# Patient Record
Sex: Male | Born: 1985 | Race: White | Hispanic: No | Marital: Single | Smoking: Former smoker
Health system: Southern US, Community
[De-identification: ages and names within clinical notes are randomized; demographics above are authoritative.]

## PROBLEM LIST (undated history)

## (undated) DIAGNOSIS — F17200 Nicotine dependence, unspecified, uncomplicated: Secondary | ICD-10-CM

## (undated) DIAGNOSIS — F988 Other specified behavioral and emotional disorders with onset usually occurring in childhood and adolescence: Secondary | ICD-10-CM

## (undated) DIAGNOSIS — L723 Sebaceous cyst: Secondary | ICD-10-CM

## (undated) HISTORY — DX: Sebaceous cyst: L72.3

## (undated) HISTORY — DX: Other specified behavioral and emotional disorders with onset usually occurring in childhood and adolescence: F98.8

## (undated) HISTORY — PX: OTHER SURGICAL HISTORY: SHX169

## (undated) HISTORY — PX: TONSILLECTOMY: SUR1361

## (undated) HISTORY — DX: Nicotine dependence, unspecified, uncomplicated: F17.200

---
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
## 2005-05-13 ENCOUNTER — Ambulatory Visit: Payer: Self-pay | Admitting: Internal Medicine

## 2005-09-29 ENCOUNTER — Ambulatory Visit: Payer: Self-pay | Admitting: Internal Medicine

## 2005-11-21 ENCOUNTER — Ambulatory Visit: Payer: Self-pay | Admitting: Internal Medicine

## 2006-02-27 ENCOUNTER — Ambulatory Visit: Payer: Self-pay | Admitting: Internal Medicine

## 2006-02-27 ENCOUNTER — Ambulatory Visit: Payer: Self-pay | Admitting: Endocrinology

## 2006-05-26 ENCOUNTER — Ambulatory Visit: Payer: Self-pay | Admitting: Internal Medicine

## 2006-08-25 ENCOUNTER — Ambulatory Visit: Payer: Self-pay | Admitting: Internal Medicine

## 2006-10-06 ENCOUNTER — Ambulatory Visit: Payer: Self-pay | Admitting: Internal Medicine

## 2007-03-23 ENCOUNTER — Ambulatory Visit: Payer: Self-pay | Admitting: Internal Medicine

## 2007-03-29 ENCOUNTER — Ambulatory Visit: Payer: Self-pay | Admitting: Internal Medicine

## 2007-08-13 ENCOUNTER — Encounter: Payer: Self-pay | Admitting: *Deleted

## 2007-08-13 DIAGNOSIS — F909 Attention-deficit hyperactivity disorder, unspecified type: Secondary | ICD-10-CM | POA: Insufficient documentation

## 2007-10-30 ENCOUNTER — Encounter: Payer: Self-pay | Admitting: Internal Medicine

## 2008-01-18 ENCOUNTER — Ambulatory Visit: Payer: Self-pay | Admitting: Internal Medicine

## 2008-01-18 DIAGNOSIS — F17200 Nicotine dependence, unspecified, uncomplicated: Secondary | ICD-10-CM | POA: Insufficient documentation

## 2008-01-18 DIAGNOSIS — L723 Sebaceous cyst: Secondary | ICD-10-CM | POA: Insufficient documentation

## 2008-04-04 ENCOUNTER — Ambulatory Visit: Payer: Self-pay | Admitting: Internal Medicine

## 2008-06-10 ENCOUNTER — Telehealth: Payer: Self-pay | Admitting: Internal Medicine

## 2008-06-17 ENCOUNTER — Telehealth: Payer: Self-pay | Admitting: Internal Medicine

## 2008-09-03 ENCOUNTER — Telehealth: Payer: Self-pay | Admitting: Internal Medicine

## 2008-12-22 ENCOUNTER — Telehealth: Payer: Self-pay | Admitting: Internal Medicine

## 2009-04-09 ENCOUNTER — Telehealth: Payer: Self-pay | Admitting: Internal Medicine

## 2009-10-01 ENCOUNTER — Telehealth: Payer: Self-pay | Admitting: Internal Medicine

## 2009-11-26 ENCOUNTER — Ambulatory Visit: Payer: Self-pay | Admitting: Internal Medicine

## 2010-04-08 ENCOUNTER — Telehealth: Payer: Self-pay | Admitting: Internal Medicine

## 2010-09-09 ENCOUNTER — Telehealth: Payer: Self-pay | Admitting: Internal Medicine

## 2010-11-27 ENCOUNTER — Emergency Department (HOSPITAL_COMMUNITY)
Admission: EM | Admit: 2010-11-27 | Discharge: 2010-11-27 | Payer: Self-pay | Source: Home / Self Care | Admitting: Emergency Medicine

## 2010-12-14 NOTE — Progress Notes (Signed)
  Phone Note Outgoing Call   Call placed by: Ami Bullins CMA,  September 10, 2010 8:47 AM Call placed to: Patient Summary of Call: called pt to inform him that prescriptions are ready. LMOVM Initial call taken by: Ami Bullins CMA,  September 10, 2010 8:48 AM  Follow-up for Phone Call        called pt and informed him that presriptions are ready for pick up, Prescriptions up front Follow-up by: Ami Bullins CMA,  September 15, 2010 2:05 PM    New/Updated Medications: ADDERALL XR 15 MG  CP24 (AMPHETAMINE-DEXTROAMPHETAMINE) 1 once daily Fill on or after 11/08/2010 ADDERALL XR 15 MG  CP24 (AMPHETAMINE-DEXTROAMPHETAMINE) 1 once daily Fill on or after 10/09/2010 ADDERALL XR 15 MG  CP24 (AMPHETAMINE-DEXTROAMPHETAMINE) 1 once daily Fill on or after 09/08/2010 ADDERALL 10 MG  TABS (AMPHETAMINE-DEXTROAMPHETAMINE) 1 by mouth PM as needed fill on or after 10/09/2010 ADDERALL 10 MG  TABS (AMPHETAMINE-DEXTROAMPHETAMINE) 1 by mouth PM as needed fill on or after 09/08/2010 ADDERALL 10 MG  TABS (AMPHETAMINE-DEXTROAMPHETAMINE) 1 by mouth PM as needed fill on or after 11/08/2010 Prescriptions: ADDERALL XR 15 MG  CP24 (AMPHETAMINE-DEXTROAMPHETAMINE) 1 once daily Fill on or after 09/08/2010  #30 x 0   Entered by:   Ami Bullins CMA   Authorized by:   Jacques Navy MD   Signed by:   Bill Salinas CMA on 09/09/2010   Method used:   Reprint   RxID:   [id number] ADDERALL XR 15 MG  CP24 (AMPHETAMINE-DEXTROAMPHETAMINE) 1 once daily Fill on or after 11/08/2010  #30 x 0   Entered by:   Ami Bullins CMA   Authorized by:   Jacques Navy MD   Signed by:   Bill Salinas CMA on 09/09/2010   Method used:   Print then Give to Patient   RxID:   [id number] ADDERALL 10 MG  TABS (AMPHETAMINE-DEXTROAMPHETAMINE) 1 by mouth PM as needed fill on or after 11/08/2010  #30 x 0   Entered by:   Ami Bullins CMA   Authorized by:   Jacques Navy MD   Signed by:   Bill Salinas CMA on 09/09/2010   Method used:   Print then  Give to Patient   RxID:   [id number] ADDERALL XR 15 MG  CP24 (AMPHETAMINE-DEXTROAMPHETAMINE) 1 once daily Fill on or after 10/09/2010  #30 x 0   Entered by:   Ami Bullins CMA   Authorized by:   Jacques Navy MD   Signed by:   Bill Salinas CMA on 09/09/2010   Method used:   Print then Give to Patient   RxID:   [id number] ADDERALL 10 MG  TABS (AMPHETAMINE-DEXTROAMPHETAMINE) 1 by mouth PM as needed fill on or after 10/09/2010  #30 x 0   Entered by:   Ami Bullins CMA   Authorized by:   Jacques Navy MD   Signed by:   Bill Salinas CMA on 09/09/2010   Method used:   Print then Give to Patient   RxID:   [id number] ADDERALL 10 MG  TABS (AMPHETAMINE-DEXTROAMPHETAMINE) 1 by mouth PM as needed fill on or after 09/08/2010  #30 x 0   Entered by:   Ami Bullins CMA   Authorized by:   Jacques Navy MD   Signed by:   Bill Salinas CMA on 09/09/2010   Method used:   Print then Give to Patient   RxID:   [id number]

## 2010-12-14 NOTE — Assessment & Plan Note (Signed)
Summary: FU ON MEDS/ NWS  #   Vital Signs:  Patient profile:   25 year old male Height:      69 inches Weight:      151 pounds BMI:     22.38 O2 Sat:      99 % on Room air Temp:     98.4 degrees F oral Pulse rate:   92 / minute BP sitting:   136 / 84  (left arm) Cuff size:   regular  Vitals Entered By: Bill Salinas CMA (November 26, 2009 3:39 PM)  O2 Flow:  Room air CC: pt here for follow up on medications. Pt needs a refill on both of his Adderall prescriptions/ab   Primary Care Provider:  Tiearra Colwell  CC:  pt here for follow up on medications. Pt needs a refill on both of his Adderall prescriptions/ab.  History of Present Illness: Has graduated from [place], [place]. 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 He is living with his dad: free rent and benefits, paid enough to make basic expenses. He has been healthy. Has resumed smoking. He still is taking adderall and prefers to resume the xr. He is also considering resuming alprazolam at bedtime to help with sleep. He would like to see a dermatologist for recurrent cysts.   Current Medications (verified): 1)  Adderall Xr 15 Mg  Cp24 (Amphetamine-Dextroamphetamine) .Marland Kitchen.. 1 Once Daily Fill On or After 05/14/2009 2)  Alprazolam 0.25 Mg  Tabs (Alprazolam) .Marland Kitchen.. 1 or 2 Prn 3)  Adderall 10 Mg  Tabs (Amphetamine-Dextroamphetamine) .Marland Kitchen.. 1 By Mouth Pm As Needed Fill On or After 10/31/2009  Allergies (verified): No Known Drug Allergies  Past History:  Past Medical History: Last updated: 01/18/2008 ADHD (ICD-314.01)  Past Surgical History: Last updated: 08/13/2007 Tonsillectomy  Family History: Last updated: 01/18/2008 father - healthy mother-healthy, mild anxiety maternal grandfather - CAD 2nd kinship w/  peptic ulcer disease, colon cancer  Social History: Last updated: 01/18/2008 Attending [employer] ('09) single  Review of Systems  The patient denies anorexia, fever, weight loss, weight gain, vision  loss, hoarseness, chest pain, dyspnea on exertion, peripheral edema, headaches, hemoptysis, abdominal pain, hematochezia, transient blindness, difficulty walking, depression, and enlarged lymph nodes.         pianful PIP joint 3rd right   Physical Exam  General:  Well-developed,well-nourished,in no acute distress; alert,appropriate and cooperative throughout examination Head:  Normocephalic and atraumatic without obvious abnormalities. No apparent alopecia or balding. Eyes:  No corneal or conjunctival inflammation noted. EOMI. Perrla. Funduscopic exam benign, without hemorrhages, exudates or papilledema. Vision grossly normal. Ears:  External ear exam shows no significant lesions or deformities.  Otoscopic examination reveals clear canals, tympanic membranes are intact bilaterally without bulging, retraction, inflammation or discharge. Hearing is grossly normal bilaterally. Neck:  full ROM, no thyromegaly, and no carotid bruits.   Lungs:  normal respiratory effort, normal breath sounds, no crackles, and no wheezes.   Heart:  normal rate, regular rhythm, and no JVD.   Abdomen:  soft, non-tender, no masses, no guarding, and no rigidity.   Msk:  normal ROM, no joint warmth, no joint instability, and no crepitation.   Pulses:  2+ radial pulse Neurologic:  alert & oriented X3, cranial nerves II-XII intact, strength normal in all extremities, gait normal, and DTRs symmetrical and normal.   Skin:  turgor normal and color normal.   Cervical Nodes:  no anterior cervical adenopathy and no posterior cervical adenopathy.  Psych:  Oriented X3, memory intact for recent and remote, and normally interactive.     Impression & Recommendations:  Problem # 1:  TOBACCO ABUSE (ICD-305.1)  Counselled on smoking cessation.  The following medications were removed from the medication list:    Chantix 1 Mg Tabs (Varenicline tartrate) .Marland Kitchen... 1 by mouth two times a day  Orders: Tobacco use cessation intensive >10  minutes (91478)  Problem # 2:  ADHD (ICD-314.01) Renewed Rx for adderall xr and adderall  Problem # 3:  Preventive Health Care (ICD-V70.0) To return for fasting lipid profile and metabolic panel  Complete Medication List: 1)  Adderall Xr 15 Mg Cp24 (Amphetamine-dextroamphetamine) .Marland Kitchen.. 1 once daily fill on or after 01/24/2010 2)  Alprazolam 0.25 Mg Tabs (Alprazolam) .Marland Kitchen.. 1 or 2 prn 3)  Adderall 10 Mg Tabs (Amphetamine-dextroamphetamine) .Marland Kitchen.. 1 by mouth pm as needed fill on or after 01/29/2010 Prescriptions: ADDERALL 10 MG  TABS (AMPHETAMINE-DEXTROAMPHETAMINE) 1 by mouth PM as needed fill on or after 01/29/2010  #30 x 0   Entered by:   Lamar Sprinkles, CMA   Authorized by:   Jacques Navy MD   Signed by:   Lamar Sprinkles, CMA on 11/26/2009   Method used:   Print then Give to Patient   RxID:   [id number] ADDERALL XR 15 MG  CP24 (AMPHETAMINE-DEXTROAMPHETAMINE) 1 once daily Fill on or after 01/24/2010  #30 x 0   Entered by:   Lamar Sprinkles, CMA   Authorized by:   Jacques Navy MD   Signed by:   Lamar Sprinkles, CMA on 11/26/2009   Method used:   Print then Give to Patient   RxID:   [id number] ADDERALL 10 MG  TABS (AMPHETAMINE-DEXTROAMPHETAMINE) 1 by mouth PM as needed fill on or after 01/01/2010  #30 x 0   Entered by:   Lamar Sprinkles, CMA   Authorized by:   Jacques Navy MD   Signed by:   Lamar Sprinkles, CMA on 11/26/2009   Method used:   Print then Give to Patient   RxID:   [id number] ADDERALL XR 15 MG  CP24 (AMPHETAMINE-DEXTROAMPHETAMINE) 1 once daily Fill on or after 12/27/2009  #30 x 0   Entered by:   Lamar Sprinkles, CMA   Authorized by:   Jacques Navy MD   Signed by:   Lamar Sprinkles, CMA on 11/26/2009   Method used:   Print then Give to Patient   RxID:   [id number] ADDERALL 10 MG  TABS (AMPHETAMINE-DEXTROAMPHETAMINE) 1 by mouth PM as needed fill on or after 12/01/2009  #30 x 0   Entered by:   Lamar Sprinkles, CMA   Authorized by:   Jacques Navy  MD   Signed by:   Lamar Sprinkles, CMA on 11/26/2009   Method used:   Print then Give to Patient   RxID:   [id number] ADDERALL XR 15 MG  CP24 (AMPHETAMINE-DEXTROAMPHETAMINE) 1 once daily Fill on or after 11/26/2009  #30 x 0   Entered by:   Lamar Sprinkles, CMA   Authorized by:   Jacques Navy MD   Signed by:   Lamar Sprinkles, CMA on 11/26/2009   Method used:   Print then Give to Patient   RxID:   [id number] ALPRAZOLAM 0.25 MG  TABS (ALPRAZOLAM) 1 or 2 prn  #60 x 5   Entered and Authorized by:   Jacques Navy MD   Signed by:   Jacques Navy MD on 11/26/2009   Method used:  Print then Give to Patient   RxID:   [id number]    Preventive Care Screening  Last Tetanus Booster:    Date:  01/14/2003    Results:  Historical

## 2010-12-14 NOTE — Progress Notes (Signed)
  Phone Note Call from Patient   Summary of Call: waiting on MD to sign prescriptions Initial call taken by: Ami Bullins CMA,  Apr 08, 2010 4:35 PM  Follow-up for Phone Call        rx sign and placed up front/ lmovm for pt to pick up.Marland KitchenMarland KitchenAlvy Beal Archie CMA  Apr 09, 2010 9:43 AM     New/Updated Medications: ADDERALL XR 15 MG  CP24 (AMPHETAMINE-DEXTROAMPHETAMINE) 1 once daily Fill on or after 03/26/2010 ADDERALL XR 15 MG  CP24 (AMPHETAMINE-DEXTROAMPHETAMINE) 1 once daily Fill on or after 04/26/2010 ADDERALL XR 15 MG  CP24 (AMPHETAMINE-DEXTROAMPHETAMINE) 1 once daily Fill on or after 05/26/2010 ADDERALL 10 MG  TABS (AMPHETAMINE-DEXTROAMPHETAMINE) 1 by mouth PM as needed fill on or after 03/31/2010 ADDERALL 10 MG  TABS (AMPHETAMINE-DEXTROAMPHETAMINE) 1 by mouth PM as needed fill on or after 05/01/2010 ADDERALL 10 MG  TABS (AMPHETAMINE-DEXTROAMPHETAMINE) 1 by mouth PM as needed fill on or after 05/31/2010 Prescriptions: ADDERALL 10 MG  TABS (AMPHETAMINE-DEXTROAMPHETAMINE) 1 by mouth PM as needed fill on or after 05/31/2010  #30 x 0   Entered by:   Ami Bullins CMA   Authorized by:   Jacques Navy MD   Signed by:   Bill Salinas CMA on 04/08/2010   Method used:   Print then Give to Patient   RxID:   [id number] ADDERALL 10 MG  TABS (AMPHETAMINE-DEXTROAMPHETAMINE) 1 by mouth PM as needed fill on or after 05/01/2010  #30 x 0   Entered by:   Ami Bullins CMA   Authorized by:   Jacques Navy MD   Signed by:   Bill Salinas CMA on 04/08/2010   Method used:   Print then Give to Patient   RxID:   [id number] ADDERALL 10 MG  TABS (AMPHETAMINE-DEXTROAMPHETAMINE) 1 by mouth PM as needed fill on or after 03/31/2010  #30 x 0   Entered by:   Ami Bullins CMA   Authorized by:   Jacques Navy MD   Signed by:   Bill Salinas CMA on 04/08/2010   Method used:   Print then Give to Patient   RxID:   [id number] ADDERALL XR 15 MG  CP24 (AMPHETAMINE-DEXTROAMPHETAMINE) 1 once daily Fill on or  after 05/26/2010  #30 x 0   Entered by:   Ami Bullins CMA   Authorized by:   Jacques Navy MD   Signed by:   Bill Salinas CMA on 04/08/2010   Method used:   Print then Give to Patient   RxID:   [id number] ADDERALL XR 15 MG  CP24 (AMPHETAMINE-DEXTROAMPHETAMINE) 1 once daily Fill on or after 04/26/2010  #30 x 0   Entered by:   Ami Bullins CMA   Authorized by:   Jacques Navy MD   Signed by:   Bill Salinas CMA on 04/08/2010   Method used:   Print then Give to Patient   RxID:   [id number] ADDERALL XR 15 MG  CP24 (AMPHETAMINE-DEXTROAMPHETAMINE) 1 once daily Fill on or after 03/26/2010  #30 x 0   Entered by:   Ami Bullins CMA   Authorized by:   Jacques Navy MD   Signed by:   Bill Salinas CMA on 04/08/2010   Method used:   Print then Give to Patient   RxID:   [id number]

## 2012-02-29 ENCOUNTER — Encounter (HOSPITAL_COMMUNITY): Payer: Self-pay | Admitting: Emergency Medicine

## 2012-02-29 ENCOUNTER — Emergency Department (HOSPITAL_COMMUNITY): Payer: BC Managed Care – PPO

## 2012-02-29 ENCOUNTER — Emergency Department (HOSPITAL_COMMUNITY)
Admission: EM | Admit: 2012-02-29 | Discharge: 2012-03-01 | Disposition: A | Discharge status: Home / Self Care | Payer: BC Managed Care – PPO | Attending: Emergency Medicine | Admitting: Emergency Medicine

## 2012-02-29 DIAGNOSIS — J40 Bronchitis, not specified as acute or chronic: Secondary | ICD-10-CM

## 2012-02-29 DIAGNOSIS — J3489 Other specified disorders of nose and nasal sinuses: Secondary | ICD-10-CM | POA: Insufficient documentation

## 2012-02-29 DIAGNOSIS — R059 Cough, unspecified: Secondary | ICD-10-CM | POA: Insufficient documentation

## 2012-02-29 DIAGNOSIS — R509 Fever, unspecified: Secondary | ICD-10-CM

## 2012-02-29 DIAGNOSIS — F17200 Nicotine dependence, unspecified, uncomplicated: Secondary | ICD-10-CM | POA: Insufficient documentation

## 2012-02-29 DIAGNOSIS — R05 Cough: Secondary | ICD-10-CM | POA: Insufficient documentation

## 2012-02-29 MED ORDER — IPRATROPIUM BROMIDE 0.02 % IN SOLN
0.5000 mg | Freq: Once | RESPIRATORY_TRACT | Status: AC
  Administered 2012-02-29: 0.5 mg via RESPIRATORY_TRACT
  Filled 2012-02-29: qty 2.5

## 2012-02-29 MED ORDER — IBUPROFEN 200 MG PO TABS
600.0000 mg | ORAL_TABLET | Freq: Once | ORAL | Status: AC
  Administered 2012-02-29: 600 mg via ORAL
  Filled 2012-02-29: qty 3

## 2012-02-29 MED ORDER — ALBUTEROL SULFATE (5 MG/ML) 0.5% IN NEBU
5.0000 mg | INHALATION_SOLUTION | Freq: Once | RESPIRATORY_TRACT | Status: AC
  Administered 2012-02-29: 5 mg via RESPIRATORY_TRACT

## 2012-02-29 NOTE — ED Notes (Signed)
Pt states he has been running a fever off and on since Monday  Pt states he has been taking tylenol and ibuprofen for the fever  Pt states he developed a cough Friday  Pt states cough is productive yellow/brown sputum  Pt states has been using Mucinex  Pt states if he takes a deep breath or laughs it causes him to cough  Pt states about an hour prior to coming in his temp 102.8  Last dose of ibuprofen was about 12 noon today  Pt states since noon he has taken chest congestion mucus relief and benzonatate 200mg 

## 2012-03-01 MED ORDER — ALBUTEROL SULFATE HFA 108 (90 BASE) MCG/ACT IN AERS
2.0000 | INHALATION_SPRAY | RESPIRATORY_TRACT | Status: DC
  Filled 2012-03-01: qty 6.7

## 2012-03-01 MED ORDER — AZITHROMYCIN 250 MG PO TABS: 250.0000 mg | ORAL_TABLET | Freq: Every day | ORAL | Status: AC

## 2012-03-01 NOTE — Discharge Instructions (Signed)

## 2012-03-01 NOTE — ED Provider Notes (Signed)
History     CSN: [id number]  Arrival date & time 02/29/12  1941   First MD Initiated Contact with Patient 02/29/12 2301      Chief Complaint  Patient presents with  . Fever     The history is provided by the patient.   the patient reports coughing congestion for approximately 5 days and now has developed fever over the last 3 days.  He denies shortness of breath but does report a tightness in his chest.  He reports when he coughs and makes his chest hurt.  He's had diarrhea he denies nausea and vomiting.  His symptoms are mild to moderate in severity.  Nothing worsens the symptoms.  Nothing improves his ..symptoms.  The patient continues to smoke cigarettes.  He denies shortness of breath.  He denies unilateral leg swelling   History reviewed. No pertinent past medical history.  Past Surgical History  Procedure Date  . Tonsillectomy   . Oral surgeries     Family History  Problem Relation Age of Onset  . Hypotension Father   . Diabetes Other     History  Substance Use Topics  . Smoking status: Current Everyday Smoker    Types: Cigarettes  . Smokeless tobacco: Not on file  . Alcohol Use: Yes     occassional      Review of Systems  Constitutional: Positive for fever.  All other systems reviewed and are negative.    Allergies  Review of patient's allergies indicates no known allergies.  Home Medications   Current Outpatient Rx  Name Route Sig Dispense Refill  . IBUPROFEN 200 MG PO TABS Oral Take 600 mg by mouth every 6 (six) hours as needed.    . AZITHROMYCIN 250 MG PO TABS Oral Take 1 tablet (250 mg total) by mouth daily. Take 2 tabs for first dose, then 1 tab for each additional dose 6 tablet 0    BP 120/61  Pulse 98  Temp(Src) 100.2 F (37.9 C) (Oral)  Resp 20  Wt 168 lb (76.204 kg)  SpO2 100%  Physical Exam  Nursing note and vitals reviewed. Constitutional: He is oriented to person, place, and time. He appears well-developed and well-nourished.    HENT:  Head: Normocephalic and atraumatic.  Eyes: EOM are normal.  Neck: Normal range of motion.  Cardiovascular: Normal rate, regular rhythm, normal heart sounds and intact distal pulses.   Pulmonary/Chest: Effort normal and breath sounds normal. No respiratory distress.  Abdominal: Soft. He exhibits no distension. There is no tenderness.  Musculoskeletal: Normal range of motion.  Neurological: He is alert and oriented to person, place, and time.  Skin: Skin is warm and dry.  Psychiatric: He has a normal mood and affect. Judgment normal.    ED Course  Procedures (including critical care time)  Labs Reviewed - No data to display Dg Chest 2 View  02/29/2012  *RADIOLOGY REPORT*  Clinical Data: Cough and fever.  Tightness in chest.  CHEST - 2 VIEW  Comparison: None.  Findings: Lungs are clear.  Heart size is normal.  No pneumothorax or pleural effusion.  IMPRESSION: Negative chest.  Original Report Authenticated By: Bernadene Bell. D'ALESSIO, M.D.     1. Bronchitis   2. Fever       MDM  This appears to have a bronchitis.  He feels much better after the breathing treatment.  Home with albuterol and a Z-Pak.  His chest x-ray has no acute pneumonia, however will treat for possible atypical pneumonia.  Patient understands to return to the ER for new or worsening symptoms        Lyanne Co, MD 03/01/12 [phone]

## 2012-06-26 ENCOUNTER — Encounter: Payer: Self-pay | Admitting: Internal Medicine

## 2012-06-26 ENCOUNTER — Ambulatory Visit (INDEPENDENT_AMBULATORY_CARE_PROVIDER_SITE_OTHER): Payer: BC Managed Care – PPO | Admitting: Internal Medicine

## 2012-06-26 VITALS — BP 114/80 | HR 68 | Temp 97.7°F | Resp 16 | Wt 165.0 lb

## 2012-06-26 DIAGNOSIS — F17200 Nicotine dependence, unspecified, uncomplicated: Secondary | ICD-10-CM

## 2012-06-26 DIAGNOSIS — M25519 Pain in unspecified shoulder: Secondary | ICD-10-CM

## 2012-06-26 DIAGNOSIS — M25511 Pain in right shoulder: Secondary | ICD-10-CM

## 2012-06-26 DIAGNOSIS — F909 Attention-deficit hyperactivity disorder, unspecified type: Secondary | ICD-10-CM

## 2012-06-26 MED ORDER — AMPHETAMINE-DEXTROAMPHET ER 10 MG PO CP24: 10.0000 mg | ORAL_CAPSULE | ORAL | Status: DC

## 2012-06-26 NOTE — Assessment & Plan Note (Signed)
Is working hard on cessation - carries pictures of diseased lungs as screen saver on PDA/smart phone.  Plan Encouraged his efforts  Resume medical treatment ADHD since he is not medicating with nicotine.

## 2012-06-26 NOTE — Progress Notes (Signed)
  Subjective:    Patient ID: Matthew Morris, male    DOB: 09/21/1986, 26 y.o.   MRN: [id number]  HPI Matthew Morris presents to discuss smoking cessation. He is also having trouble with focus and concentration, difficulty completing tasks. He feels he is very distractable.  He is having right shoulder pain: strained the shoulder working out. He has been doing some home treatment which helped. But, he notices there is a "pop" with use particularly with  Rotational movement. He does not have pain. He wants further evaluation as to the nature of the injury and what he can or cannot do to avoid further injury.  Past Medical History  Diagnosis Date  . ADD (attention deficit disorder)    Past Surgical History  Procedure Date  . Tonsillectomy   . Oral surgeries    Family History  Problem Relation Age of Onset  . Hypotension Father   . Diabetes Other   . Depression Mother    History   Social History  . Marital Status: Single    Spouse Name: N/A    Number of Children: 0  . Years of Education: 16   Occupational History  . Manufacturing engineer    Social History Main Topics  . Smoking status: Former Smoker    Types: Cigarettes    Quit date: 04/26/2012  . Smokeless tobacco: Never Used  . Alcohol Use: Yes     occassional  . Drug Use: Yes    Special: Marijuana  . Sexually Active: Yes -- Male partner(s)   Other Topics Concern  . Not on file   Social History Narrative   HSG, [employer] BS, single. 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    Current Outpatient Prescriptions on File Prior to Visit  Medication Sig Dispense Refill  . ibuprofen (ADVIL,MOTRIN) 200 MG tablet Take 600 mg by mouth every 6 (six) hours as needed.      Marland Kitchen amphetamine-dextroamphetamine (ADDERALL XR) 10 MG 24 hr capsule Take 1 capsule (10 mg total) by mouth every morning.  30 capsule  0      Review of Systems System review is negative for any constitutional, cardiac,  pulmonary, GI or neuro symptoms or complaints other than as described in the HPI.     Objective:   Physical Exam Filed Vitals:   06/26/12 1314  BP: 114/80  Pulse: 68  Temp: 97.7 F (36.5 C)  Resp: 16   Gen'l- WNWD white man in no distress. HEENT- C&S clear Cor- RRR Pulm - normal respirations MSK - right shoulder with passive full ROM, no crepitus, no click.        Assessment & Plan:  Shoulder pain, right - suspect partial rotator cuff tear. Plan  Refer to Dr. Farris Has @ [facility] for sports medicine evaluation.

## 2012-06-26 NOTE — Assessment & Plan Note (Signed)
H/o ADHD and on adderall during college. He now reports increased distractability and focus, difficulty with task completion, exacerbated with absence of nicotine.  Plan Resume Adderall XR 10 mg qAM -   Completed Controlled Substance Contract  Rx for 3 months.

## 2012-10-03 ENCOUNTER — Other Ambulatory Visit: Payer: Self-pay | Admitting: *Deleted

## 2012-10-03 MED ORDER — AMPHETAMINE-DEXTROAMPHET ER 10 MG PO CP24: 10.0000 mg | ORAL_CAPSULE | ORAL | Status: DC

## 2012-10-09 ENCOUNTER — Other Ambulatory Visit (INDEPENDENT_AMBULATORY_CARE_PROVIDER_SITE_OTHER): Payer: BC Managed Care – PPO

## 2012-10-09 ENCOUNTER — Ambulatory Visit (INDEPENDENT_AMBULATORY_CARE_PROVIDER_SITE_OTHER): Payer: BC Managed Care – PPO | Admitting: Internal Medicine

## 2012-10-09 ENCOUNTER — Encounter: Payer: Self-pay | Admitting: Internal Medicine

## 2012-10-09 ENCOUNTER — Other Ambulatory Visit: Payer: Self-pay | Admitting: *Deleted

## 2012-10-09 VITALS — BP 116/78 | HR 83 | Temp 97.6°F | Resp 10 | Wt 167.0 lb

## 2012-10-09 DIAGNOSIS — Z0000 Encounter for general adult medical examination without abnormal findings: Secondary | ICD-10-CM

## 2012-10-09 DIAGNOSIS — F17200 Nicotine dependence, unspecified, uncomplicated: Secondary | ICD-10-CM

## 2012-10-09 DIAGNOSIS — F909 Attention-deficit hyperactivity disorder, unspecified type: Secondary | ICD-10-CM

## 2012-10-09 LAB — LIPID PANEL
Total CHOL/HDL Ratio: 5
Triglycerides: 285 mg/dL — ABNORMAL HIGH (ref 0.0–149.0)

## 2012-10-09 LAB — COMPREHENSIVE METABOLIC PANEL
AST: 25 U/L (ref 0–37)
Albumin: 4.3 g/dL (ref 3.5–5.2)
Alkaline Phosphatase: 69 U/L (ref 39–117)
BUN: 13 mg/dL (ref 6–23)
Potassium: 4.1 mEq/L (ref 3.5–5.1)
Sodium: 138 mEq/L (ref 135–145)

## 2012-10-09 MED ORDER — AMPHETAMINE-DEXTROAMPHET ER 10 MG PO CP24: 10.0000 mg | ORAL_CAPSULE | ORAL | Status: DC

## 2012-10-09 NOTE — Patient Instructions (Addendum)
Mr. Tuite is a healthy appearing 26 year old male who presents for medication follow-up.   1. ADHD -Continue Adderall 10 mg daily  2. Smoking Cessation -Last cigarette was Jun 21 2012 -Congratulations! Keep up the good work!! -Let us know if we can help with medications or nicotine replacement therapy should you need it in the future

## 2012-10-09 NOTE — Progress Notes (Signed)
Patient ID: VICKY MCCANLESS, male   DOB: Mar 28, 1986, 26 y.o.   MRN: [id number]  *needs prescription refill  HPI Mr. Lomax is a very pleasant 26 yo male who presents for medication follow-up. He has ADHD and recently starting retaking Adderall 10 mg daily. He recently quit smoking and noted increased attention problems with smoking cessation. He was restarted on Adderall at that time.  He feels that the Adderall is currently at an effective dose. Smoking cessation has continued and is going well; last cigarette was August 8. He has not supplemented with nicotine gum or patches. He uses regular gum, toothpicks and jogging to maintain his success. He feels confident that he has quit permanently.  He takes no other medications and feels well overall.   ROS -Denies fevers, chills, cough, muscle aches +Improvement in breathing with smoking cessation +Improvement in exercise tolerance with smoking cessation   Past Medical History  Diagnosis Date  . ADD (attention deficit disorder)    Past Surgical History  Procedure Date  . Tonsillectomy   . Oral surgeries    Family History  Problem Relation Age of Onset  . Hypotension Father   . Diabetes Other   . Depression Mother    History   Social History  . Marital Status: Single    Spouse Name: N/A    Number of Children: 0  . Years of Education: 16   Occupational History  . Manufacturing engineer    Social History Main Topics  . Smoking status: Former Smoker    Types: Cigarettes    Quit date: 04/26/2012  . Smokeless tobacco: Never Used  . Alcohol Use: Yes     Comment: occassional  . Drug Use: Yes    Special: Marijuana  . Sexually Active: Yes -- Male partner(s)   Other Topics Concern  . Not on file   Social History Narrative   HSG, [employer] BS, single. 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   Current Outpatient Prescriptions on File Prior to Visit  Medication Sig Dispense Refill   . amphetamine-dextroamphetamine (ADDERALL XR) 10 MG 24 hr capsule Take 1 capsule (10 mg total) by mouth every morning. To be filled on or after 12/03/12  30 capsule  0  . ibuprofen (ADVIL,MOTRIN) 200 MG tablet Take 600 mg by mouth every 6 (six) hours as needed.       PE General: Healthy, fit appearing young male in NAD CV: Regular rate and rhythm, S1 and S2 present. Pulse regular. Pulm: Lungs clear to auscultation bilaterally Neuro: Alert and appropriate. No focal deficits.  A/P Mr. Campoy is a 26 yo male who presents for medication follow-up.   # ADHD -Continue Adderall 10 mg daily  # Smoking Cessation -Last cigarette was Jun 21 2012 -Congratulations and encouragement given  Patient interviewed and examined. I agree with the assessment and plan per Ms. Landa Mullinax, MS III  M.Norins, MD

## 2012-10-10 LAB — LDL CHOLESTEROL, DIRECT: Direct LDL: 136.3 mg/dL

## 2012-10-11 NOTE — Assessment & Plan Note (Signed)
Doing well. Adderall XR 10 mg is helpful. He reports normal sleep pattern. He denies any untoward symptoms.  Plan - renewal of adderall Rx.

## 2012-10-11 NOTE — Assessment & Plan Note (Signed)
Smoke free since August '13 . [facility]!!!!!

## 2012-12-19 ENCOUNTER — Ambulatory Visit (INDEPENDENT_AMBULATORY_CARE_PROVIDER_SITE_OTHER): Payer: BC Managed Care – PPO | Admitting: Internal Medicine

## 2012-12-19 ENCOUNTER — Encounter: Payer: Self-pay | Admitting: Internal Medicine

## 2012-12-19 ENCOUNTER — Other Ambulatory Visit: Payer: Self-pay | Admitting: *Deleted

## 2012-12-19 VITALS — BP 120/80 | HR 92 | Temp 97.8°F | Resp 10 | Wt 159.0 lb

## 2012-12-19 DIAGNOSIS — N489 Disorder of penis, unspecified: Secondary | ICD-10-CM

## 2012-12-19 DIAGNOSIS — F909 Attention-deficit hyperactivity disorder, unspecified type: Secondary | ICD-10-CM

## 2012-12-19 DIAGNOSIS — R21 Rash and other nonspecific skin eruption: Secondary | ICD-10-CM

## 2012-12-19 DIAGNOSIS — J069 Acute upper respiratory infection, unspecified: Secondary | ICD-10-CM

## 2012-12-19 MED ORDER — AMPHETAMINE-DEXTROAMPHET ER 10 MG PO CP24: 10.0000 mg | ORAL_CAPSULE | ORAL | Status: DC

## 2012-12-19 NOTE — Patient Instructions (Addendum)
Rash - sounds like and looks like either a mild yeast infection vs abrasion with soreness Plan - it would be ok to use topical antifungal for 3-4 days  URI - most likely a viral infection. Lungs are very clear, no enlarged lymph nodes. No indication for antibiotics Plan - cough in your sleeve  Cough syrup of choice - doctor preference is Robitussin DM or generic.  Don't pick up those smokes again!!

## 2012-12-19 NOTE — Progress Notes (Signed)
  Subjective:    Patient ID: Matthew Morris, male    DOB: 09/07/1986, 27 y.o.   MRN: [id number]  HPI Had URI symptoms that started Friday and continued through the weekend. Temperature to 100.4. Did a lot of self care and feels a lot better. He still feels he has some chest congestion: coughs with laughing.   Needs adderall Rx's   Had unprotected intercourse with girlfriend of several months. Developed an erythematous pruritic rash at the glans penis and shaft. He only used a topical antifungal for one day. Still having some sensitivity.  Past Medical History  Diagnosis Date  . ADD (attention deficit disorder)    Past Surgical History  Procedure Date  . Tonsillectomy   . Oral surgeries    Family History  Problem Relation Age of Onset  . Hypotension Father   . Diabetes Other   . Depression Mother    History   Social History  . Marital Status: Single    Spouse Name: N/A    Number of Children: 0  . Years of Education: 16   Occupational History  . Manufacturing engineer    Social History Main Topics  . Smoking status: Former Smoker    Types: Cigarettes    Quit date: 04/26/2012  . Smokeless tobacco: Never Used  . Alcohol Use: Yes     Comment: occassional  . Drug Use: Yes    Special: Marijuana  . Sexually Active: Yes -- Male partner(s)   Other Topics Concern  . Not on file   Social History Narrative   HSG, [place] BS, single. 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   Current Outpatient Prescriptions on File Prior to Visit  Medication Sig Dispense Refill  . amphetamine-dextroamphetamine (ADDERALL XR) 10 MG 24 hr capsule Take 1 capsule (10 mg total) by mouth every morning. To be filled on or after 12/09/12.  30 capsule  0  . ibuprofen (ADVIL,MOTRIN) 200 MG tablet Take 600 mg by mouth every 6 (six) hours as needed.           Review of Systems System review is negative for any constitutional, cardiac, pulmonary, GI or  neuro symptoms or complaints other than as described in the HPI.     Objective:   Physical Exam Filed Vitals:   12/19/12 0928  BP: 120/80  Pulse: 92  Temp: 97.8 F (36.6 C)  Resp: 10   Gen'l0- WNWD white man in no distress HEENT- posterior pharynx with mild cobblestone appearance. Nodes - negative submandibular, cervical or supraclavicular Cor- RRR Pulm - good breath sounds, no rales or wheezes or rhonchi. No increased work of breathing. Genitalia - normal circumcised penis. Mild erythema of the shaft at the glans. Minimal raised areas 2mm but no open lesions       Assessment & Plan:  1. URI - lungs are clear, remainder or exam negative. No indications for antibiotics Plan  Supportive care.  2. Penile rash - suspect mild abrasion vs topical yeast infection Plan Topical antifungal bid for 5 days.

## 2012-12-20 NOTE — Assessment & Plan Note (Signed)
Refill Rx for Adderall provided.

## 2012-12-29 ENCOUNTER — Other Ambulatory Visit: Payer: Self-pay

## 2013-04-03 ENCOUNTER — Ambulatory Visit (INDEPENDENT_AMBULATORY_CARE_PROVIDER_SITE_OTHER): Payer: BC Managed Care – PPO | Admitting: Internal Medicine

## 2013-04-03 ENCOUNTER — Ambulatory Visit (INDEPENDENT_AMBULATORY_CARE_PROVIDER_SITE_OTHER)
Admission: RE | Admit: 2013-04-03 | Discharge: 2013-04-03 | Disposition: A | Discharge status: Home / Self Care | Payer: BC Managed Care – PPO | Source: Ambulatory Visit | Attending: Internal Medicine | Admitting: Internal Medicine

## 2013-04-03 ENCOUNTER — Encounter: Payer: Self-pay | Admitting: Internal Medicine

## 2013-04-03 VITALS — BP 120/68 | HR 80 | Temp 98.4°F | Wt 152.4 lb

## 2013-04-03 DIAGNOSIS — M65839 Other synovitis and tenosynovitis, unspecified forearm: Secondary | ICD-10-CM

## 2013-04-03 DIAGNOSIS — M79609 Pain in unspecified limb: Secondary | ICD-10-CM

## 2013-04-03 DIAGNOSIS — M79644 Pain in right finger(s): Secondary | ICD-10-CM

## 2013-04-03 DIAGNOSIS — F909 Attention-deficit hyperactivity disorder, unspecified type: Secondary | ICD-10-CM

## 2013-04-03 DIAGNOSIS — M778 Other enthesopathies, not elsewhere classified: Secondary | ICD-10-CM

## 2013-04-03 DIAGNOSIS — F17200 Nicotine dependence, unspecified, uncomplicated: Secondary | ICD-10-CM

## 2013-04-03 MED ORDER — AMPHETAMINE-DEXTROAMPHET ER 15 MG PO CP24: 15.0000 mg | ORAL_CAPSULE | ORAL | Status: DC

## 2013-04-03 NOTE — Assessment & Plan Note (Signed)
Slight increase Adderall provided today follow up PCP in 4-6 weeks on same Advised over-the-counter therapies for associated insomnia

## 2013-04-03 NOTE — Patient Instructions (Signed)
It was good to see you today. We have reviewed your prior records including labs and tests today Test(s) ordered today - thumb x-ray. Your results will be released to MyChart (or called to you) after review, usually within 72hours after test completion. If any changes need to be made, you will be notified at that same time. No indication for chest x-ray, but you should continue to work on smoking cessation as discussed For thumb pain, take Aleve one tablet ( 220 mg)  each morning for 2 weeks Increase dose of Adderall as discussed Follow up with Dr. Debby Bud on medications and symptoms in 4-8 weeks, call sooner if problems  Tendinitis Tendinitis is swelling and inflammation of the tendons. Tendons are band-like tissues that connect muscle to bone. Tendinitis commonly occurs in the:   Shoulders (rotator cuff).  Heels (Achilles tendon).  Elbows (triceps tendon). CAUSES Tendinitis is usually caused by overusing the tendon, muscles, and joints involved. When the tissue surrounding a tendon (synovium) becomes inflamed, it is called tenosynovitis. Tendinitis commonly develops in people whose jobs require repetitive motions. SYMPTOMS  Pain.  Tenderness.  Mild swelling. DIAGNOSIS Tendinitis is usually diagnosed by physical exam. Your caregiver may also order X-rays or other imaging tests. TREATMENT Your caregiver may recommend certain medicines or exercises for your treatment. HOME CARE INSTRUCTIONS   Use a sling or splint for as long as directed by your caregiver until the pain decreases.  Put ice on the injured area.  Put ice in a plastic bag.  Place a towel between your skin and the bag.  Leave the ice on for 15-20 minutes, 3-4 times a day.  Avoid using the limb while the tendon is painful. Perform gentle range of motion exercises only as directed by your caregiver. Stop exercises if pain or discomfort increase, unless directed otherwise by your caregiver.  Only take  over-the-counter or prescription medicines for pain, discomfort, or fever as directed by your caregiver. SEEK MEDICAL CARE IF:   Your pain and swelling increase.  You develop new, unexplained symptoms, especially increased numbness in the hands. MAKE SURE YOU:   Understand these instructions.  Will watch your condition.  Will get help right away if you are not doing well or get worse. Document Released: 10/28/2000 Document Revised: 01/23/2012 Document Reviewed: 01/17/2011 [facility] Patient Information 2014 [place], [place].

## 2013-04-03 NOTE — Progress Notes (Signed)
  Subjective:    Patient ID: Matthew Morris, male    DOB: 1985-11-22, 27 y.o.   MRN: [id number]  HPI  Here for followup Also complains of thumb pain Also ?increase dose adderall to help stop smoking Also reports poor sleep Also requests CXR   Past Medical History  Diagnosis Date  . ADD (attention deficit disorder)   . TOBACCO ABUSE   . Sebaceous cyst     Review of Systems  Constitutional: Negative for fever and fatigue.  Respiratory: Negative for cough and shortness of breath.   Cardiovascular: Negative for chest pain and leg swelling.       Objective:   Physical Exam  BP 120/68  Pulse 80  Temp(Src) 98.4 F (36.9 C) (Oral)  Wt 152 lb 6.4 oz (69.128 kg)  BMI 22.5 kg/m2  SpO2 99% Wt Readings from Last 3 Encounters:  04/03/13 152 lb 6.4 oz (69.128 kg)  12/19/12 159 lb 0.6 oz (72.14 kg)  10/09/12 167 lb (75.751 kg)   Constitutional: he appears well-developed and well-nourished. No distress.  Cardiovascular: Normal rate, regular rhythm and normal heart sounds.  No murmur heard. No BLE edema. Pulmonary/Chest: Effort normal and breath sounds normal. No respiratory distress. he has no wheezes.  Musculoskeletal: R thumb - FROM, no joint swelling at [facility] and PIP. nontender to palpation, ligamentous function intact. No gross deformities Skin: No bruising or rash. Skin is warm and dry. No erythema.  Psychiatric: he has a normal mood and affect. behavior is normal. Judgment and thought content normal.  Lab Results  Component Value Date   GLUCOSE 94 10/09/2012   CHOL 206* 10/09/2012   TRIG 285.0* 10/09/2012   HDL 44.00 10/09/2012   LDLDIRECT 136.3 10/09/2012   ALT 21 10/09/2012   AST 25 10/09/2012   NA 138 10/09/2012   K 4.1 10/09/2012   CL 100 10/09/2012   CREATININE 1.0 10/09/2012   BUN 13 10/09/2012   CO2 31 10/09/2012       Assessment & Plan:   R thumb tendonitis,  - traumatic induced injury 6 weeks ago - ligamentous function intact with good strength - no  joint swelling but check xray to look for [facility] OA changes or damage at pt request. Advised Aleve qd x 2 weeks - reassurance  Smoker - asymptomatic but advised on need to quit smoking -  pt requests xray (as mom with lung ca) -I advised no indication for same given 27 and no symptoms (no cough or shortness of breath) -  5 minutes today spent counseling patient on unhealthy effects of continued tobacco abuse and encouragement of cessation including medical options available to help the patient quit smoking.

## 2013-05-06 ENCOUNTER — Telehealth: Payer: Self-pay | Admitting: Internal Medicine

## 2013-05-06 NOTE — Telephone Encounter (Signed)
Pt was wondering if Dr. Debby Bud can give some adderall to last him until he come in for an appt which is 05/27/13. Pt will be out by 05/15/13. Please advise.

## 2013-05-06 NOTE — Telephone Encounter (Signed)
Ok for 30 days supply

## 2013-05-07 MED ORDER — AMPHETAMINE-DEXTROAMPHET ER 15 MG PO CP24: 15.0000 mg | ORAL_CAPSULE | ORAL | Status: DC

## 2013-05-07 NOTE — Telephone Encounter (Signed)
Left voicemail msg script can be picked up today.

## 2013-05-07 NOTE — Telephone Encounter (Signed)
Script printed and waiting to be signed

## 2013-05-27 ENCOUNTER — Ambulatory Visit (INDEPENDENT_AMBULATORY_CARE_PROVIDER_SITE_OTHER): Payer: BC Managed Care – PPO | Admitting: Internal Medicine

## 2013-05-27 ENCOUNTER — Encounter: Payer: Self-pay | Admitting: Internal Medicine

## 2013-05-27 VITALS — BP 120/80 | HR 66 | Temp 97.3°F | Resp 8 | Wt 157.8 lb

## 2013-05-27 DIAGNOSIS — F909 Attention-deficit hyperactivity disorder, unspecified type: Secondary | ICD-10-CM

## 2013-05-27 DIAGNOSIS — F17200 Nicotine dependence, unspecified, uncomplicated: Secondary | ICD-10-CM

## 2013-05-27 MED ORDER — AMPHETAMINE-DEXTROAMPHET ER 15 MG PO CP24: 15.0000 mg | ORAL_CAPSULE | ORAL | Status: DC

## 2013-05-27 NOTE — Assessment & Plan Note (Signed)
Has been smoking but is working hard on cessation again. Given support

## 2013-05-27 NOTE — Patient Instructions (Addendum)
Work on the smoking

## 2013-05-27 NOTE — Assessment & Plan Note (Signed)
Stable and doing well. No diet issues, no hyperactivity issues. Holding work.  Plan Refill Rx x 3 months.

## 2013-05-27 NOTE — Progress Notes (Signed)
  Subjective:    Patient ID: Matthew Morris, male    DOB: 1986/10/10, 27 y.o.   MRN: [id number]  HPI Presents for med refill on adderall. He has been doing well.  He had a large bruise over the greater trochanter on the right hip.  He had a small insect bite at the edge of the bruise.  Past Medical History  Diagnosis Date  . ADD (attention deficit disorder)   . TOBACCO ABUSE   . Sebaceous cyst    Past Surgical History  Procedure Laterality Date  . Tonsillectomy    . Oral surgeries     Family History  Problem Relation Age of Onset  . Hypotension Father   . Diabetes Other   . Depression Mother    History   Social History  . Marital Status: Single    Spouse Name: N/A    Number of Children: 0  . Years of Education: 16   Occupational History  . Manufacturing engineer    Social History Main Topics  . Smoking status: Current Every Day Smoker    Types: Cigarettes    Last Attempt to Quit: 04/26/2012  . Smokeless tobacco: Never Used  . Alcohol Use: Yes     Comment: occassional  . Drug Use: Yes    Special: Marijuana  . Sexually Active: Yes -- Male partner(s)   Other Topics Concern  . Not on file   Social History Narrative   HSG, [place] BS, single. 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 Just built his own house, 1900 sq ft. Mother Barbie Haggis) recovering from cancer. Brother - family trying to help him overcome EtOH issues.    No current outpatient prescriptions on file prior to visit.   No current facility-administered medications on file prior to visit.      Review of Systems System review is negative for any constitutional, cardiac, pulmonary, GI or neuro symptoms or complaints other than as described in the HPI.      Objective:   Physical Exam Filed Vitals:   05/27/13 0945  BP: 120/80  Pulse: 66  Temp: 97.3 F (36.3 C)  Resp: 8   Gen'l- WNWD white man in no distress COr- RRR Pulm - normal  respirations Derm - resolving bruise over greater trochanter right hip Neuro - stable      Assessment & Plan:

## 2013-07-02 ENCOUNTER — Ambulatory Visit (INDEPENDENT_AMBULATORY_CARE_PROVIDER_SITE_OTHER): Payer: BC Managed Care – PPO | Admitting: Internal Medicine

## 2013-07-02 ENCOUNTER — Encounter: Payer: Self-pay | Admitting: Internal Medicine

## 2013-07-02 VITALS — BP 110/72 | HR 68 | Temp 97.9°F | Wt 159.0 lb

## 2013-07-02 DIAGNOSIS — R109 Unspecified abdominal pain: Secondary | ICD-10-CM

## 2013-07-02 NOTE — Patient Instructions (Addendum)
Thanks for working with me Romeo Apple) today.  Side pain: this does not seem to be anything dangerous.  If this continues to be a problem, please take note of the circumstances surrounding the episodes (what you were doing, whether you ate anything recently, etc.) Plan: If the pain gets worse or lasts longer, please return to the clinic. Otherwise, bring a log of events to your next visit.

## 2013-07-02 NOTE — Progress Notes (Signed)
Subjective:     Patient ID: Matthew Morris, male   DOB: Oct 15, 1986, 27 y.o.   MRN: [id number]  HPI  Mr. Depass presents to clinic due to a shooting pain on left side which has bothered him sporadically for the past few weeks.  He rates it as 6/10 and distracts him from whatever he was doing.  The pain has not changed in character over its course.  This pain will last for 5-15 seconds and resolves on its own.  He has not been able to detect any pattern to the onset of this pain, either temporarily (it occurs both in the morning and at night) or with regards to any particular activity.  He states that it has occurred twice after eating fatty meals, but has also bothered him several mornings after eating a light breakfast.  He has not identified any provoking or alleviating factors.  He notes that he has been able to apply pressure and that seems to improve the pain, but since the pain persists for such a short time, he has not been able to try anything to improve the pain.  Past Medical History  Diagnosis Date   ADD (attention deficit disorder)    TOBACCO ABUSE    Sebaceous cyst    Past Surgical History  Procedure Laterality Date   Tonsillectomy     Oral surgeries     Family History  Problem Relation Age of Onset   Hypotension Father    Diabetes Other    Depression Mother    History   Social History   Marital Status: Single    Spouse Name: N/A    Number of Children: 0   Years of Education: 16   Occupational History   Manufacturing engineer    Social History Main Topics   Smoking status: Current Every Day Smoker    Types: Cigarettes    Last Attempt to Quit: 04/26/2012   Smokeless tobacco: Never Used   Alcohol Use: Yes     Comment: occassional   Drug Use: Yes    Special: Marijuana   Sexual Activity: Yes    Partners: Female   Other Topics Concern   Not on file   Social History Narrative   HSG, [place] BS, single. 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 Just built his own house, 1900 sq ft. Mother Barbie Haggis) recovering from cancer. Brother - family trying to help him overcome EtOH issues.   Review of Systems Constitutional: Negative for change in activity, no chills, no diaphoresis HEENT: Negative for acute changes in vision/hearing, ear pain, or trouble swallowing  Pulm: Negative for coughing, wheezing or SOB  CV: Negative for chest pain or palpitations  GI: Negative distention, pain, discomfort, nausea, vomiting, constipation or diarrhea  Extremities: Negative for swelling, discomfort or stiffness     Objective:   Physical Exam General: Mr. Slinger is a 27 year-old gentleman here in NAD. HEENT: normocephalic, atraumatic, external ears normal CV: RRR, nl S1/S2, no murmurs, rubs or gallops. Pulm: no increased WOB, no wheezes or rales Abdomen: slight tenderness to palpitation in the LUQ, spleen not palpable, otherwise not tender to palpation     Assessment and Plan:     The side pain    Mr. Richman has been experiencing is puzzling. The lack of any discernable pattern in its presentation greatly complicates determining the etiology. However, his history and physical findings are not consistent with any acutely dangerous pathology.  Plan:  If this continues to be a problem, Mr. Hemmelgarn is to take note of the circumstances surrounding the episodes.  If his pain gets worse, Mr. Burdi is to schedule a follow up visit with this clinic; otherwise he will see Dr. Debby Bud at his next scheduled visit (in six weeks).

## 2013-08-08 ENCOUNTER — Ambulatory Visit (INDEPENDENT_AMBULATORY_CARE_PROVIDER_SITE_OTHER): Payer: BC Managed Care – PPO | Admitting: Internal Medicine

## 2013-08-08 ENCOUNTER — Encounter: Payer: Self-pay | Admitting: Internal Medicine

## 2013-08-08 VITALS — BP 140/78 | HR 76 | Temp 97.8°F | Wt 161.4 lb

## 2013-08-08 DIAGNOSIS — J069 Acute upper respiratory infection, unspecified: Secondary | ICD-10-CM

## 2013-08-08 NOTE — Patient Instructions (Addendum)
Viral Upper respiratory infection - you seem to already getting better. No indication for antibiotics.  PLan Sudafed 30 mg three times  Nasal saline or nettie pot  Tylenol 500 mg 2 tabs = 1,000 mg three times a day on schedule  Stove top vaporizer - pot of boiling water, turn it off, put in a dollop of vicks or methalatum and make a towel tent and breath deep  Hydrate - gatoraide etc.  Vitamin C helps  1500 mg daily  Ecchinacea tea or elixer or tabs - revs up the immune system.  Upper Respiratory Infection, Adult An upper respiratory infection (URI) is also sometimes known as the common cold. The upper respiratory tract includes the nose, sinuses, throat, trachea, and bronchi. Bronchi are the airways leading to the lungs. Most people improve within 1 week, but symptoms can last up to 2 weeks. A residual cough may last even longer.  CAUSES Many different viruses can infect the tissues lining the upper respiratory tract. The tissues become irritated and inflamed and often become very moist. Mucus production is also common. A cold is contagious. You can easily spread the virus to others by oral contact. This includes kissing, sharing a glass, coughing, or sneezing. Touching your mouth or nose and then touching a surface, which is then touched by another person, can also spread the virus. SYMPTOMS  Symptoms typically develop 1 to 3 days after you come in contact with a cold virus. Symptoms vary from person to person. They may include:  Runny nose.  Sneezing.  Nasal congestion.  Sinus irritation.  Sore throat.  Loss of voice (laryngitis).  Cough.  Fatigue.  Muscle aches.  Loss of appetite.  Headache.  Low-grade fever. DIAGNOSIS  You might diagnose your own cold based on familiar symptoms, since most people get a cold 2 to 3 times a year. Your caregiver can confirm this based on your exam. Most importantly, your caregiver can check that your symptoms are not due to another disease  such as strep throat, sinusitis, pneumonia, asthma, or epiglottitis. Blood tests, throat tests, and X-rays are not necessary to diagnose a common cold, but they may sometimes be helpful in excluding other more serious diseases. Your caregiver will decide if any further tests are required. RISKS AND COMPLICATIONS  You may be at risk for a more severe case of the common cold if you smoke cigarettes, have chronic heart disease (such as heart failure) or lung disease (such as asthma), or if you have a weakened immune system. The very young and very old are also at risk for more serious infections. Bacterial sinusitis, middle ear infections, and bacterial pneumonia can complicate the common cold. The common cold can worsen asthma and chronic obstructive pulmonary disease (COPD). Sometimes, these complications can require emergency medical care and may be life-threatening. PREVENTION  The best way to protect against getting a cold is to practice good hygiene. Avoid oral or hand contact with people with cold symptoms. Wash your hands often if contact occurs. There is no clear evidence that vitamin C, vitamin E, echinacea, or exercise reduces the chance of developing a cold. However, it is always recommended to get plenty of rest and practice good nutrition. TREATMENT  Treatment is directed at relieving symptoms. There is no cure. Antibiotics are not effective, because the infection is caused by a virus, not by bacteria. Treatment may include:  Increased fluid intake. Sports drinks offer valuable electrolytes, sugars, and fluids.  Breathing heated mist or steam (vaporizer or  shower).  Eating chicken soup or other clear broths, and maintaining good nutrition.  Getting plenty of rest.  Using gargles or lozenges for comfort.  Controlling fevers with ibuprofen or acetaminophen as directed by your caregiver.  Increasing usage of your inhaler if you have asthma. Zinc gel and zinc lozenges, taken in the first  24 hours of the common cold, can shorten the duration and lessen the severity of symptoms. Pain medicines may help with fever, muscle aches, and throat pain. A variety of non-prescription medicines are available to treat congestion and runny nose. Your caregiver can make recommendations and may suggest nasal or lung inhalers for other symptoms.  HOME CARE INSTRUCTIONS   Only take over-the-counter or prescription medicines for pain, discomfort, or fever as directed by your caregiver.  Use a warm mist humidifier or inhale steam from a shower to increase air moisture. This may keep secretions moist and make it easier to breathe.  Drink enough water and fluids to keep your urine clear or pale yellow.  Rest as needed.  Return to work when your temperature has returned to normal or as your caregiver advises. You may need to stay home longer to avoid infecting others. You can also use a face mask and careful hand washing to prevent spread of the virus. SEEK MEDICAL CARE IF:   After the first few days, you feel you are getting worse rather than better.  You need your caregiver's advice about medicines to control symptoms.  You develop chills, worsening shortness of breath, or brown or red sputum. These may be signs of pneumonia.  You develop yellow or brown nasal discharge or pain in the face, especially when you bend forward. These may be signs of sinusitis.  You develop a fever, swollen neck glands, pain with swallowing, or white areas in the back of your throat. These may be signs of strep throat. SEEK IMMEDIATE MEDICAL CARE IF:   You have a fever.  You develop severe or persistent headache, ear pain, sinus pain, or chest pain.  You develop wheezing, a prolonged cough, cough up blood, or have a change in your usual mucus (if you have chronic lung disease).  You develop sore muscles or a stiff neck. Document Released: 04/26/2001 Document Revised: 01/23/2012 Document Reviewed:  03/04/2011 [facility] Patient Information 2014 [place], [place].

## 2013-08-08 NOTE — Progress Notes (Signed)
  Subjective:    Patient ID: Matthew Morris, male    DOB: [birth year], 27 y.o.   MRN: [id number]  HPI Interval since last visit - no recurrent chest pain but he had one episode that was clearly indigestion.  He went to a [employer] game last Sunday and yelled until he was hoarse - lost his voice. He felt he over did it and lowered his resistance. Waking up Monday he had a stuffy nose, pressure in the maxillary sinus, lots of mucus production. Pressure in the periorbital area. He took a sick day Monday. Called out of work Tuesday with similar symptoms plus sore throat. Started nasal decongestant - sudafed with APAP, alkaselzer cold and flu nighttime. Wednesday missed more work. He does feel better but his symptoms are lingering.   No fever, no rigors. Kept up the fluids. Never had any significant    Review of Systems Constitutional:  Negative for fever, chills, activity change and unexpected weight change.  HEENT:  Negative for hearing loss, ear pain, congestion, neck stiffness and postnasal drip. Negative for sore throat or swallowing problems. Negative for dental complaints.   Eyes: Negative for vision loss or change in visual acuity.  Respiratory: Negative for chest tightness and wheezing. Negative for DOE.   Cardiovascular: Negative for chest pain or palpitations. No decreased exercise tolerance Gastrointestinal: No change in bowel habit. No bloating or gas. No reflux or indigestion Genitourinary: Negative for urgency, frequency, flank pain and difficulty urinating.  Musculoskeletal: Negative for myalgias, back pain, arthralgias and gait problem.  Neurological: Negative for dizziness, tremors, weakness and headaches.  Hematological: Negative for adenopathy.  Psychiatric/Behavioral: Negative for behavioral problems and dysphoric mood.       Objective:   Physical Exam Filed Vitals:   08/08/13 1628  BP: 140/78  Pulse: 76  Temp: 97.8 F (36.6 C)   gen'l - WNWD white man in no  distress HEENT - TMs normal, throat with cobblestone appearance, mild sinus tenderness maxillary, poor transillumination maxillary sinus. Nodes - neg Cor- RRR PUlm - lungs clear to A&P        Assessment & Plan:  Viral Upper respiratory infection - you seem to already getting better. No indication for antibiotics.  PLan Sudafed 30 mg three times  Nasal saline or nettie pot  Tylenol 500 mg 2 tabs = 1,000 mg three times a day on schedule  Stove top vaporizer - pot of boiling water, turn it off, put in a dollop of vicks or methalatum and make a towel tent and breath deep  Hydrate - gatoraide etc.  Vitamin C helps  1500 mg daily  Ecchinacea tea or elixer or tabs - revs up the immune system.

## 2013-09-12 ENCOUNTER — Ambulatory Visit (INDEPENDENT_AMBULATORY_CARE_PROVIDER_SITE_OTHER): Payer: BC Managed Care – PPO | Admitting: Internal Medicine

## 2013-09-12 ENCOUNTER — Encounter: Payer: Self-pay | Admitting: Internal Medicine

## 2013-09-12 VITALS — BP 122/80 | HR 64 | Temp 97.4°F | Wt 162.8 lb

## 2013-09-12 DIAGNOSIS — J069 Acute upper respiratory infection, unspecified: Secondary | ICD-10-CM

## 2013-09-12 DIAGNOSIS — F909 Attention-deficit hyperactivity disorder, unspecified type: Secondary | ICD-10-CM

## 2013-09-12 MED ORDER — AMPHETAMINE-DEXTROAMPHET ER 15 MG PO CP24: 15.0000 mg | ORAL_CAPSULE | ORAL | Status: DC

## 2013-09-12 NOTE — Patient Instructions (Signed)
1. Viral upper respiratory infection has resolved. No clinical evidence of mono.  2. Avoid the E-cigarette  3. Ear wax build up - irrigated today. Routine care - once or twice a month use ear wax removal drops, let dwell and then flush with warm water.  4. Adderal refills today.

## 2013-09-12 NOTE — Progress Notes (Signed)
  Subjective:    Patient ID: Matthew Morris, male    DOB: 04-06-1986, 27 y.o.   MRN: [id number]  HPI Kees was seen Sept 25th for viral infection. He found out afterward that he may have been exposed to mono. He has not had any fever, no swollen glands and in general he has been feeling better. He does have a history of mono in high school.  He has been researching the effects of E-cigarettes and risks.   PMH, FamHx and SocHx reviewed for any changes and relevance.  Current Outpatient Prescriptions on File Prior to Visit  Medication Sig Dispense Refill  . amphetamine-dextroamphetamine (ADDERALL XR) 15 MG 24 hr capsule Take 1 capsule (15 mg total) by mouth every morning. To be filled on or after 08/16/13.  30 capsule  0   No current facility-administered medications on file prior to visit.      Review of Systems System review is negative for any constitutional, cardiac, pulmonary, GI or neuro symptoms or complaints other than as described in the HPI.      Objective:   Physical Exam Filed Vitals:   09/12/13 0948  BP: 122/80  Pulse: 64  Temp: 97.4 F (36.3 C)   gen'l - WNWD white male.  HEENT- right EAC with cerumen impaction. No nodes Cor - RRR Pulm - CTAP Neuro - non-focal       Assessment & Plan:  Viral URI - symptoms have resolved. He is reassured that the clinical picture does not suggest EBV - mono.

## 2013-09-14 NOTE — Assessment & Plan Note (Signed)
Renewal of Adderall Rx's

## 2013-09-19 ENCOUNTER — Other Ambulatory Visit: Payer: Self-pay

## 2013-11-12 ENCOUNTER — Ambulatory Visit (INDEPENDENT_AMBULATORY_CARE_PROVIDER_SITE_OTHER): Payer: BC Managed Care – PPO | Admitting: Internal Medicine

## 2013-11-12 ENCOUNTER — Encounter: Payer: Self-pay | Admitting: Internal Medicine

## 2013-11-12 VITALS — BP 140/90 | HR 80 | Temp 98.2°F | Resp 16 | Wt 169.0 lb

## 2013-11-12 DIAGNOSIS — J069 Acute upper respiratory infection, unspecified: Secondary | ICD-10-CM | POA: Insufficient documentation

## 2013-11-12 DIAGNOSIS — D485 Neoplasm of uncertain behavior of skin: Secondary | ICD-10-CM | POA: Insufficient documentation

## 2013-11-12 MED ORDER — AZITHROMYCIN 250 MG PO TABS: ORAL_TABLET | ORAL | Status: DC

## 2013-11-12 MED ORDER — PODOFILOX 0.5 % EX GEL: Freq: Two times a day (BID) | CUTANEOUS | Status: DC

## 2013-11-12 NOTE — Progress Notes (Signed)
   Subjective:    Patient ID: Matthew Morris, male    DOB: 11/22/1985, 27 y.o.   MRN: [id number]  HPI  C/o URI sx's x 1 week C/o a bump on penis  Review of Systems  Constitutional: Negative for chills and fatigue.  HENT: Positive for congestion, sinus pressure and sore throat. Negative for ear pain.   Respiratory: Positive for cough. Negative for shortness of breath.        Objective:   Physical Exam  Constitutional: He is oriented to person, place, and time. He appears well-developed. No distress.  NAD  HENT:  Mouth/Throat: Oropharynx is clear and moist.  eryth throat  Eyes: Conjunctivae are normal. Pupils are equal, round, and reactive to light.  Neck: Normal range of motion. No JVD present. No thyromegaly present.  Cardiovascular: Normal rate, regular rhythm, normal heart sounds and intact distal pulses.  Exam reveals no gallop and no friction rub.   No murmur heard. Pulmonary/Chest: Effort normal and breath sounds normal. No respiratory distress. He has no wheezes. He has no rales. He exhibits no tenderness.  Abdominal: Soft. Bowel sounds are normal. He exhibits no distension and no mass. There is no tenderness. There is no rebound and no guarding.  Musculoskeletal: Normal range of motion. He exhibits no edema and no tenderness.  Lymphadenopathy:    He has no cervical adenopathy.  Neurological: He is alert and oriented to person, place, and time. He has normal reflexes. No cranial nerve deficit. He exhibits normal muscle tone. He displays a negative Romberg sign. Coordination and gait normal.  No meningeal signs  Skin: Skin is warm and dry. No rash noted.  3x6 mm papule on penile shaft  Psychiatric: He has a normal mood and affect. His behavior is normal. Judgment and thought content normal.          Assessment & Plan:

## 2013-11-12 NOTE — Progress Notes (Signed)
Pre visit review using our clinic review tool, if applicable. No additional management support is needed unless otherwise documented below in the visit note. 

## 2013-11-12 NOTE — Patient Instructions (Signed)

## 2013-11-12 NOTE — Assessment & Plan Note (Signed)
Zpac 

## 2013-11-12 NOTE — Assessment & Plan Note (Signed)
12/14 penile ?wart most likely vs other Options discussed Will try Condylox

## 2013-11-14 ENCOUNTER — Encounter: Payer: Self-pay | Admitting: Internal Medicine

## 2013-11-27 ENCOUNTER — Encounter: Payer: Self-pay | Admitting: Internal Medicine

## 2013-11-27 ENCOUNTER — Ambulatory Visit (INDEPENDENT_AMBULATORY_CARE_PROVIDER_SITE_OTHER): Payer: BC Managed Care – PPO | Admitting: Internal Medicine

## 2013-11-27 VITALS — BP 112/82 | HR 103

## 2013-11-27 DIAGNOSIS — S0990XA Unspecified injury of head, initial encounter: Secondary | ICD-10-CM

## 2013-11-27 MED ORDER — AMPHETAMINE-DEXTROAMPHET ER 15 MG PO CP24: 15.0000 mg | ORAL_CAPSULE | ORAL | Status: DC

## 2013-11-27 NOTE — Progress Notes (Signed)
Pre visit review using our clinic review tool, if applicable. No additional management support is needed unless otherwise documented below in the visit note. 
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
## 2013-12-13 ENCOUNTER — Telehealth: Payer: Self-pay | Admitting: Internal Medicine

## 2013-12-13 NOTE — Telephone Encounter (Signed)
Relevant patient education assigned to patient using Emmi. ° °

## 2014-03-26 ENCOUNTER — Ambulatory Visit (INDEPENDENT_AMBULATORY_CARE_PROVIDER_SITE_OTHER): Payer: BC Managed Care – PPO | Admitting: Internal Medicine

## 2014-03-26 ENCOUNTER — Encounter: Payer: Self-pay | Admitting: Internal Medicine

## 2014-03-26 VITALS — BP 112/80 | HR 75 | Temp 97.8°F | Wt 169.0 lb

## 2014-03-26 DIAGNOSIS — F17200 Nicotine dependence, unspecified, uncomplicated: Secondary | ICD-10-CM

## 2014-03-26 DIAGNOSIS — G479 Sleep disorder, unspecified: Secondary | ICD-10-CM

## 2014-03-26 DIAGNOSIS — F909 Attention-deficit hyperactivity disorder, unspecified type: Secondary | ICD-10-CM

## 2014-03-26 MED ORDER — AMPHETAMINE-DEXTROAMPHET ER 20 MG PO CP24: 20.0000 mg | ORAL_CAPSULE | ORAL | Status: DC

## 2014-03-26 NOTE — Patient Instructions (Addendum)
Your next office appointment will be determined based upon review of your pending labs . Those instructions will be transmitted to you through My Chart  . To prevent sleep dysfunction follow these instructions for sleep hygiene. Do not read, watch TV, or eat in bed. Do not get into bed until you are ready to turn off the light &  to go to sleep. Do not ingest stimulants ( decongestants, diet pills, nicotine, caffeine) after the evening meal.Do not take daytime naps.Cardiovascular exercise, this can be as simple a program as walking, is recommended 30-45 minutes 3-4 times per week. If you're not exercising you should take 6-8 weeks to build up to this level.

## 2014-03-26 NOTE — Progress Notes (Signed)
   Subjective:    Patient ID: Matthew Morris, male    DOB: 12-18-1985, 28 y.o.   MRN: [id number]  HPI He is here to assess ADHD. New job requires he take Adderall 3 hours earlier resulting in mental fatigue by afternoon. Taking short non time nap to recover. PMH, FH, & Social history verified & updated . Problem List updated    Review of Systems Weight gain of 10 # over 2 mos.  No weakness;heart racing or skipping, abnormal sweating. Nausea or vomiting,loss of appetite,constipation, diarrhea denied. No tremor, mental status change (hyperactive or lethargy), or headache. Anxiety due to job change. No depression, loss of interest, panic attacks, or excess alcohol use. No change in skin/hair/nails except some loss over crown.        Objective:   Physical Exam Gen.: Healthy and well-nourished in appearance. Alert, appropriate and cooperative throughout exam. Appears younger than stated age  Head: Normocephalic without obvious abnormalities;  pattern alopecia  Eyes: No corneal or conjunctival inflammation noted. Pupils equal round reactive to light and accommodation. Extraocular motion intact.No lid lag or proptosis. Ears: External  ear exam reveals no significant lesions or deformities.Nose: External nasal exam reveals no deformity or inflammation. Nasal mucosa are pink and moist. No lesions or exudates noted.   Mouth: Oral mucosa and oropharynx reveal no lesions or exudates. Teeth in good repair. Neck: No deformities, masses, or tenderness noted.  Thyroid normal Lungs: Normal respiratory effort; chest expands symmetrically. Lungs are clear to auscultation without rales, wheezes, or increased work of breathing. Heart: Normal rate and rhythm. Normal S1 and S2. No gallop, click, or rub. No murmur. Abdomen: Bowel sounds normal; abdomen soft and nontender. No masses, organomegaly or hernias noted.                                 Musculoskeletal/extremities: No deformity or scoliosis noted of   the thoracic or lumbar spine.  No clubbing, cyanosis, edema, or significant extremity  deformity noted. Range of motion normal .Tone & strength normal. Hand joints normal  Fingernail / toenail health good. Able to lie down & sit up w/o help. Negative SLR bilaterally Vascular: Carotid, radial artery, dorsalis pedis and  posterior tibial pulses are full and equal. No bruits present. Neurologic: Alert and oriented x3. Deep tendon reflexes symmetrical and normal.  Gait normal   Skin: Intact without suspicious lesions or rashes. Lymph: No cervical, axillary lymphadenopathy present. Psych: Mood and affect are normal. Normally interactive                                                                                        Assessment & Plan:  See Current Assessment & Plan in Problem List under specific Diagnosis.  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 This was discussed with him. He was given the opportunity of titrating Wellbutrin in place of Adderall. At this time is reluctant to do so. The Adderall will be increased  Sleep hygiene interventions discussed.

## 2014-03-26 NOTE — Progress Notes (Signed)
Pre visit review using our clinic review tool, if applicable. No additional management support is needed unless otherwise documented below in the visit note. 

## 2014-04-15 ENCOUNTER — Encounter: Payer: Self-pay | Admitting: Internal Medicine

## 2014-04-30 ENCOUNTER — Ambulatory Visit (INDEPENDENT_AMBULATORY_CARE_PROVIDER_SITE_OTHER): Payer: BC Managed Care – PPO | Admitting: Internal Medicine

## 2014-04-30 ENCOUNTER — Encounter: Payer: Self-pay | Admitting: Internal Medicine

## 2014-04-30 VITALS — BP 120/88 | HR 100 | Temp 98.4°F | Wt 169.4 lb

## 2014-04-30 DIAGNOSIS — S61239A Puncture wound without foreign body of unspecified finger without damage to nail, initial encounter: Secondary | ICD-10-CM

## 2014-04-30 DIAGNOSIS — F909 Attention-deficit hyperactivity disorder, unspecified type: Secondary | ICD-10-CM

## 2014-04-30 DIAGNOSIS — Z23 Encounter for immunization: Secondary | ICD-10-CM

## 2014-04-30 DIAGNOSIS — S61209A Unspecified open wound of unspecified finger without damage to nail, initial encounter: Secondary | ICD-10-CM

## 2014-04-30 MED ORDER — AMPHETAMINE-DEXTROAMPHET ER 20 MG PO CP24: 20.0000 mg | ORAL_CAPSULE | ORAL | Status: DC

## 2014-04-30 NOTE — Patient Instructions (Signed)
To prevent sleep dysfunction follow these instructions for sleep hygiene. Do not read, watch TV, or eat in bed. Do not get into bed until you are ready to turn off the light &  to go to sleep. Do not ingest stimulants ( decongestants, diet pills, nicotine, caffeine) after the evening meal.Do not take daytime naps.Cardiovascular exercise, this can be as simple a program as walking, is recommended 30-45 minutes 3-4 times per week. If you're not exercising you should take 6-8 weeks to build up to this level. 

## 2014-04-30 NOTE — Progress Notes (Signed)
   Subjective:    Patient ID: Matthew Morris, male    DOB: 10/09/1986, 28 y.o.   MRN: [id number]  HPI   He punctured his third right finger on the palmar surface 04/26/14 on a non rusty nail. He cleaned it with hydrogen peroxide and also applied Neosporin. Last tetanus was approximately 9 years and 7 months ago.  He also is here to refill his Adderall XR 20 mg. This has been very effective with no adverse effects.  Previously he been on 15 mg XL plus 5-10 mg immediate  release in afternoon as needed.  His medication was not working as well previously because of distractions related to his work schedule which involved commuting to [place] as well as breakup of a long term relationship. There also were financial issues  The medicine is working much better now that he eats breakfast with fruit/vegetable juice in the morning and takes his medicines approximately 20 minutes later.  His sleep pattern is also improved. Since college he never went to bed before midnight and not gotten up before 7. He's finally adjusted to getting up at 5:30 am.      Review of Systems  He denies any fever, chills, sweats  There's been no purulence or evidence of cellulitis from the puncture wound.     Objective:   Physical Exam  Pupils are large but reactive to light.  There is 3 mm closed laceration of the palmar surface of the third right digit. There is no evidence of cellulitis.  He has no epitrochlear, axillary, or cervical lymph nodes on that side. Gen.:  well-nourished; in no acute distress Eyes: Extraocular motion intact; no lid lag or proptosis ,nystagmus Neck: full ROM; no masses ; thyroid normal  Heart: Normal rhythm and rate without significant murmur, gallop, or extra heart sounds Lungs: Chest clear to auscultation without rales,rales, wheezes Neuro:Deep tendon reflexes are equal and within normal limits; no tremor  Skin: Warm and dry without significant lesions or rashes; no  onycholysis Lymphatic: no cervical or axillary LA Psych: Normally communicative and interactive; no abnormal mood or affect clinically.         Assessment & Plan:    #1 puncture wound with no evidence of cellulitis. Tetanus booster will be given  #2 ADD; excellent response to the present medication  #3 sleep disorder, improved  See after visit summary.

## 2014-04-30 NOTE — Progress Notes (Signed)
Pre visit review using our clinic review tool, if applicable. No additional management support is needed unless otherwise documented below in the visit note. 

## 2014-08-05 ENCOUNTER — Encounter: Payer: Self-pay | Admitting: Internal Medicine

## 2014-08-05 ENCOUNTER — Ambulatory Visit (INDEPENDENT_AMBULATORY_CARE_PROVIDER_SITE_OTHER): Payer: BC Managed Care – PPO | Admitting: Internal Medicine

## 2014-08-05 VITALS — BP 118/64 | HR 76 | Temp 98.1°F | Resp 18 | Ht 69.0 in | Wt 170.4 lb

## 2014-08-05 DIAGNOSIS — F909 Attention-deficit hyperactivity disorder, unspecified type: Secondary | ICD-10-CM

## 2014-08-05 DIAGNOSIS — F17200 Nicotine dependence, unspecified, uncomplicated: Secondary | ICD-10-CM

## 2014-08-05 MED ORDER — AMPHETAMINE-DEXTROAMPHET ER 20 MG PO CP24: 20.0000 mg | ORAL_CAPSULE | ORAL | Status: DC

## 2014-08-05 NOTE — Progress Notes (Signed)
Pre visit review using our clinic review tool, if applicable. No additional management support is needed unless otherwise documented below in the visit note. 

## 2014-08-05 NOTE — Patient Instructions (Signed)
We will refill your adderall today. Consider stopping cigarettes: two options to help would be nicotine patches or a medicine called bupropion. I will give you some information about those that you can think about.  We will see you back in about 1 year if you are doing well or sooner if you are feeling sick or have problems.   Bupropion sustained-release tablets (smoking cessation) What is this medicine? BUPROPION (byoo PROE pee on) is used to help people quit smoking. This medicine may be used for other purposes; ask your health care provider or pharmacist if you have questions. COMMON BRAND NAME(S): Buproban, Zyban What should I tell my health care provider before I take this medicine? They need to know if you have any of these conditions: -an eating disorder, such as anorexia or bulimia -bipolar disorder or psychosis -diabetes or high blood sugar, treated with medication -glaucoma -head injury or brain tumor -heart disease, previous heart attack, or irregular heart beat -high blood pressure -kidney or liver disease -seizures -suicidal thoughts or a previous suicide attempt -Tourette's syndrome -weight loss -an unusual or allergic reaction to bupropion, other medicines, foods, dyes, or preservatives -breast-feeding -pregnant or trying to become pregnant How should I use this medicine? Take this medicine by mouth with a glass of water. Follow the directions on the prescription label. You can take it with or without food. If it upsets your stomach, take it with food. Do not cut, crush or chew this medicine. Take your medicine at regular intervals. If you take this medicine more than once a day, take your second dose at least 8 hours after you take your first dose. To limit difficulty in sleeping, avoid taking this medicine at bedtime. Do not take your medicine more often than directed. Do not stop taking this medicine suddenly except upon the advice of your doctor. Stopping this medicine  too quickly may cause serious side effects. A special MedGuide will be given to you by the pharmacist with each prescription and refill. Be sure to read this information carefully each time. Talk to your pediatrician regarding the use of this medicine in children. Special care may be needed. Overdosage: If you think you have taken too much of this medicine contact a poison control center or emergency room at once. NOTE: This medicine is only for you. Do not share this medicine with others. What if I miss a dose? If you miss a dose, skip the missed dose and take your next tablet at the regular time. There should be at least 8 hours between doses. Do not take double or extra doses. What may interact with this medicine? Do not take this medicine with any of the following medications: -linezolid -MAOIs like Azilect, Carbex, Eldepryl, Marplan, Nardil, and Parnate -methylene blue (injected into a vein) -other medicines that contain bupropion like Wellbutrin This medicine may also interact with the following medications: -alcohol -certain medicines for anxiety or sleep -certain medicines for blood pressure like metoprolol, propranolol -certain medicines for depression or psychotic disturbances -certain medicines for HIV or AIDS like efavirenz, lopinavir, nelfinavir, ritonavir -certain medicines for irregular heart beat like propafenone, flecainide -certain medicines for Parkinson's disease like amantadine, levodopa -certain medicines for seizures like carbamazepine, phenytoin, phenobarbital -cimetidine -clopidogrel -cyclophosphamide -furazolidone -isoniazid -nicotine -orphenadrine -procarbazine -steroid medicines like prednisone or cortisone -stimulant medicines for attention disorders, weight loss, or to stay awake -tamoxifen -theophylline -thiotepa -ticlopidine -tramadol -warfarin This list may not describe all possible interactions. Give your health care provider a list of  all the  medicines, herbs, non-prescription drugs, or dietary supplements you use. Also tell them if you smoke, drink alcohol, or use illegal drugs. Some items may interact with your medicine. What should I watch for while using this medicine? Visit your doctor or health care professional for regular checks on your progress. This medicine should be used together with a patient support program. It is important to participate in a behavioral program, counseling, or other support program that is recommended by your health care professional. Patients and their families should watch out for new or worsening thoughts of suicide or depression. Also watch out for sudden changes in feelings such as feeling anxious, agitated, panicky, irritable, hostile, aggressive, impulsive, severely restless, overly excited and hyperactive, or not being able to sleep. If this happens, especially at the beginning of treatment or after a change in dose, call your health care professional. Avoid alcoholic drinks while taking this medicine. Drinking excessive alcoholic beverages, using sleeping or anxiety medicines, or quickly stopping the use of these agents while taking this medicine may increase your risk for a seizure. Do not drive or use heavy machinery until you know how this medicine affects you. This medicine can impair your ability to perform these tasks. Do not take this medicine close to bedtime. It may prevent you from sleeping. Your mouth may get dry. Chewing sugarless gum or sucking hard candy, and drinking plenty of water may help. Contact your doctor if the problem does not go away or is severe. Do not use nicotine patches or chewing gum without the advice of your doctor or health care professional while taking this medicine. You may need to have your blood pressure taken regularly if your doctor recommends that you use both nicotine and this medicine together. What side effects may I notice from receiving this medicine? Side  effects that you should report to your doctor or health care professional as soon as possible: -allergic reactions like skin rash, itching or hives, swelling of the face, lips, or tongue -breathing problems -changes in vision -confusion -fast or irregular heartbeat -hallucinations -increased blood pressure -redness, blistering, peeling or loosening of the skin, including inside the mouth -seizures -suicidal thoughts or other mood changes -unusually weak or tired -vomiting Side effects that usually do not require medical attention (report to your doctor or health care professional if they continue or are bothersome): -change in sex drive or performance -constipation -headache -loss of appetite -nausea -tremors -weight loss This list may not describe all possible side effects. Call your doctor for medical advice about side effects. You may report side effects to FDA at 1-800-FDA-1088. Where should I keep my medicine? Keep out of the reach of children. Store at room temperature between 20 and 25 degrees C (68 and 77 degrees F). Protect from light. Keep container tightly closed. Throw away any unused medicine after the expiration date. NOTE: This sheet is a summary. It may not cover all possible information. If you have questions about this medicine, talk to your doctor, pharmacist, or health care provider.  2015, [employer]. (2013-06-28 10:55:10)

## 2014-08-06 NOTE — Assessment & Plan Note (Signed)
Spoke with him about other options for quitting including the nicotine patch, bupropion. He will think about it and work on quitting.

## 2014-08-06 NOTE — Progress Notes (Signed)
   Subjective:    Patient ID: Matthew Morris, male    DOB: 1985-12-11, 28 y.o.   MRN: [id number]  HPI The patient is a 28 YO man who is coming in today to establish care. He has PMH of ADHD, tobacco abuse. He is doing well overall and has been trying to taper down a little on his ADHD medicine. He does not take 1 day per week, when he was off it for a couple weeks he did notice the difference and is able to work better with it. 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 He is still smoking and has tried to quit before but did not last long. He has never tried patches but did try chantix and did not like how that made him feel. He has also tried cold [place] which did not work well. He does drink maybe 1-2 days per week with friends that he plays sports with. He does not drink other nights when he is at home.   Review of Systems  Constitutional: Negative for fever, activity change, appetite change and fatigue.  HENT: Negative.   Eyes: Negative.   Respiratory: Negative for cough, chest tightness, shortness of breath and wheezing.   Cardiovascular: Negative for chest pain, palpitations and leg swelling.  Gastrointestinal: Negative for abdominal pain, diarrhea, constipation and abdominal distention.  Endocrine: Negative.   Genitourinary: Negative.   Musculoskeletal: Negative.   Skin: Negative.   Allergic/Immunologic: Negative.   Neurological: Negative.   Psychiatric/Behavioral: Negative.       Objective:   Physical Exam  Vitals reviewed. Constitutional: He is oriented to person, place, and time. He appears well-developed and well-nourished. No distress.  HENT:  Head: Normocephalic and atraumatic.  Eyes: EOM are normal.  Neck: Normal range of motion.  Cardiovascular: Normal rate and regular rhythm.   No murmur heard. Pulmonary/Chest: Effort normal and breath sounds normal. No respiratory distress. He has no wheezes. He has no rales.  Abdominal: Soft.  Bowel sounds are normal. He exhibits no distension. There is no tenderness. There is no rebound.  Neurological: He is alert and oriented to person, place, and time. Coordination normal.  Skin: Skin is warm and dry.   Filed Vitals:   08/05/14 1509  BP: 118/64  Pulse: 76  Temp: 98.1 F (36.7 C)  TempSrc: Oral  Resp: 18  Height: 5\' 9"  (1.753 m)  Weight: 170 lb 6.4 oz (77.293 kg)  SpO2: 99%      Assessment & Plan:

## 2014-08-06 NOTE — Assessment & Plan Note (Signed)
Spoke with him about the need to taper over the next several years (starting with non-working days) and working down on dose. Rx for adderall given today and he will be seen back in 1 year.

## 2014-08-19 ENCOUNTER — Ambulatory Visit: Payer: BC Managed Care – PPO | Admitting: Internal Medicine

## 2014-09-05 ENCOUNTER — Telehealth: Payer: Self-pay | Admitting: Internal Medicine

## 2014-09-05 DIAGNOSIS — F909 Attention-deficit hyperactivity disorder, unspecified type: Secondary | ICD-10-CM

## 2014-09-05 MED ORDER — AMPHETAMINE-DEXTROAMPHET ER 20 MG PO CP24: 20.0000 mg | ORAL_CAPSULE | ORAL | Status: DC

## 2014-09-05 NOTE — Telephone Encounter (Signed)
Please call and let him know that he can pick up 3 month's worth of prescriptions and he should take them all to the pharmacy and they can hold them there for him so he can get them filled.

## 2014-09-05 NOTE — Telephone Encounter (Signed)
Patient is requesting script for adderall xr

## 2014-09-05 NOTE — Telephone Encounter (Signed)
Notified pt with md response. place in cabinet...Matthew Morris

## 2014-09-17 IMAGING — CR DG FINGER THUMB 2+V*R*
2 series · 2 of 2 positions shown · non-contrast
Comparison: Hand films 11/27/2010

CLINICAL DATA: Pain.  Injury 6 weeks ago.  Pain at carpal
metacarpal joint.

RIGHT THUMB 2+V

[view not recorded (1 of 2)]
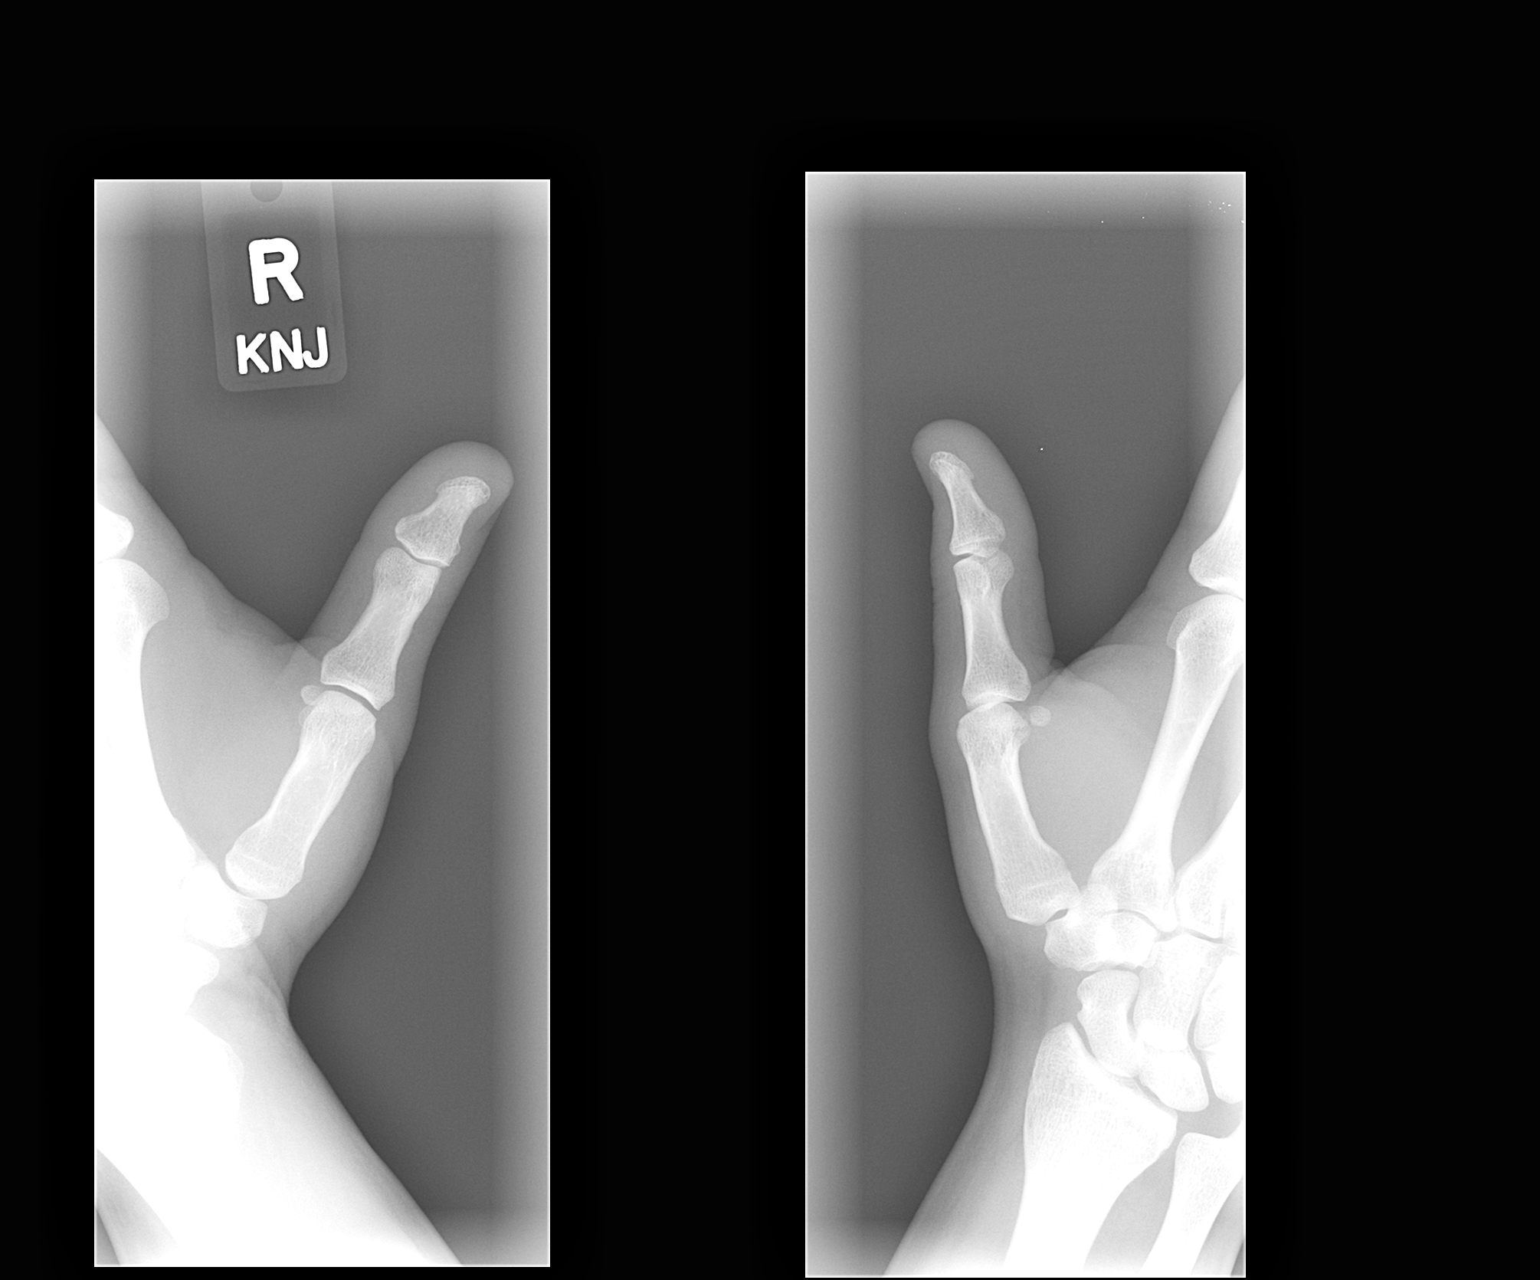

[view not recorded (2 of 2)]
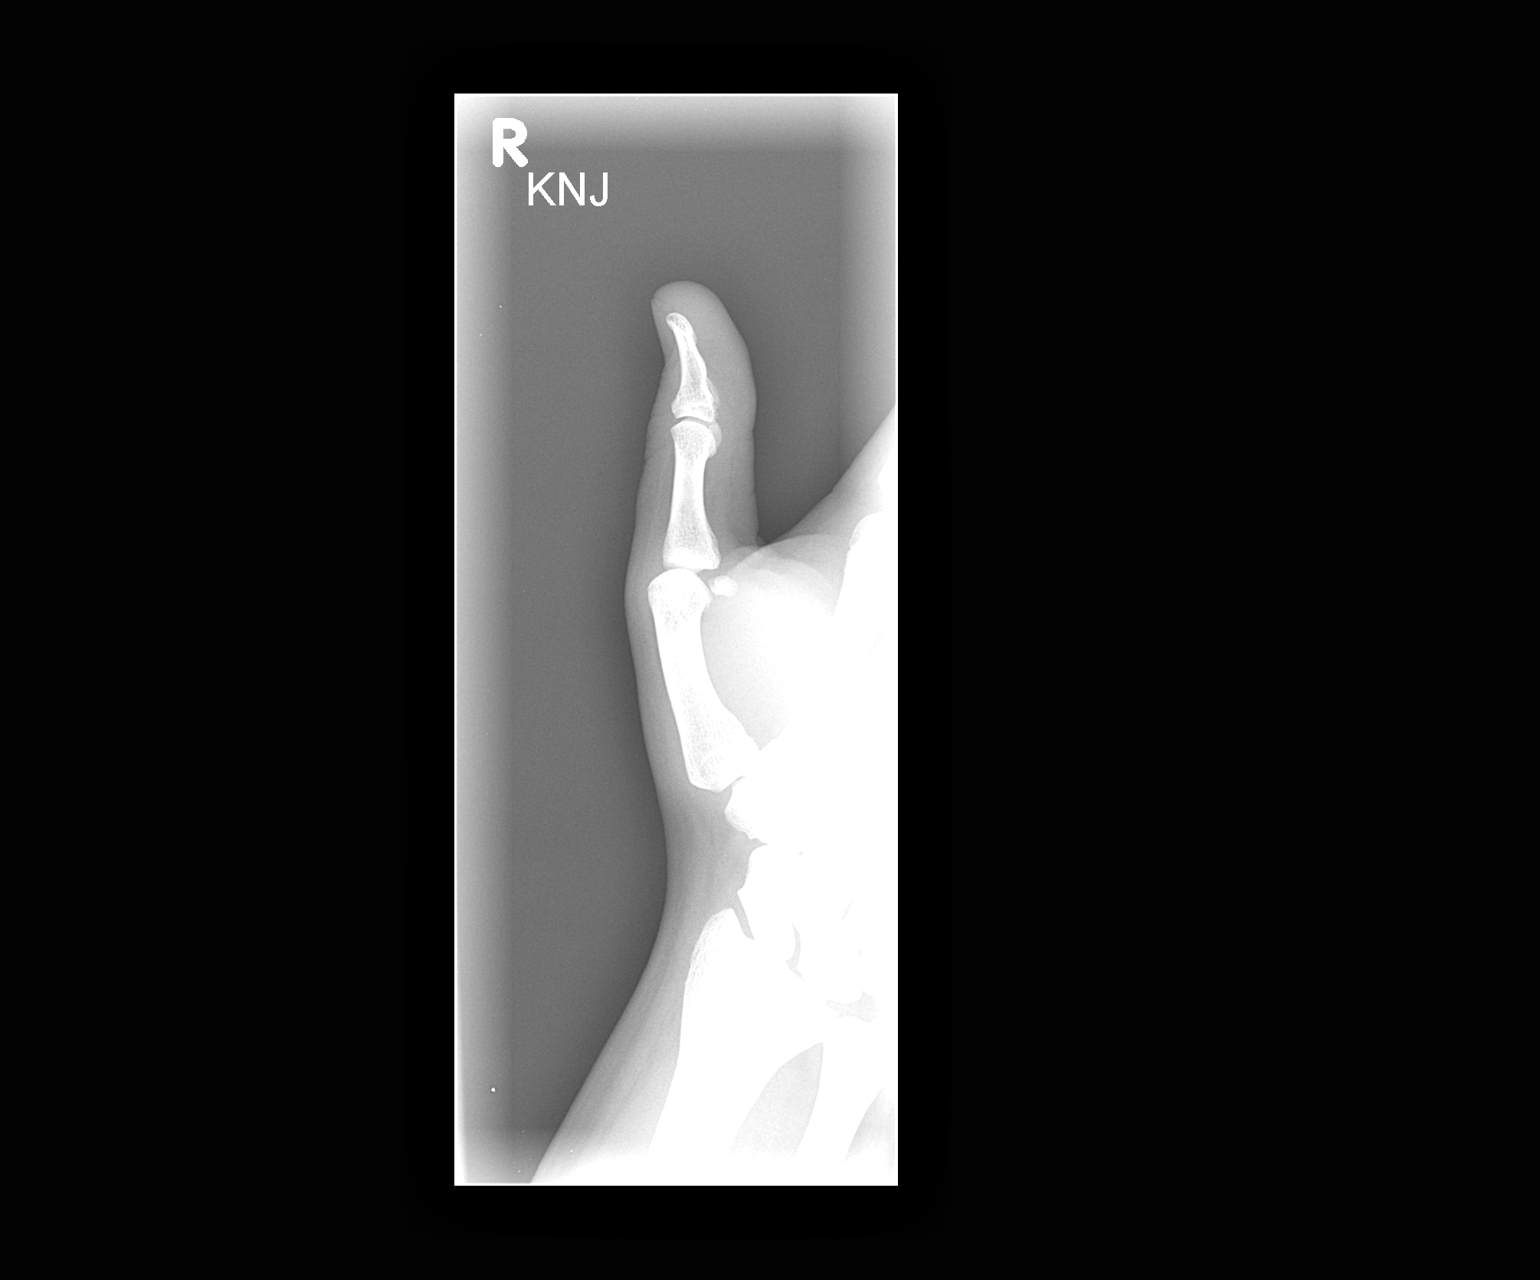

[2 of 2 positions shown; findings below may reference images not displayed]

FINDINGS: No acute fracture or dislocation.
IMPRESSION: No acute osseous abnormality.

## 2014-10-16 ENCOUNTER — Encounter: Payer: Self-pay | Admitting: Internal Medicine

## 2014-10-16 ENCOUNTER — Ambulatory Visit (INDEPENDENT_AMBULATORY_CARE_PROVIDER_SITE_OTHER): Payer: BC Managed Care – PPO | Admitting: Internal Medicine

## 2014-10-16 VITALS — BP 136/84 | HR 80 | Temp 98.3°F | Resp 16 | Ht 68.0 in | Wt 167.8 lb

## 2014-10-16 DIAGNOSIS — J209 Acute bronchitis, unspecified: Secondary | ICD-10-CM

## 2014-10-16 MED ORDER — FLUTICASONE PROPIONATE 50 MCG/ACT NA SUSP: 2.0000 | Freq: Every day | NASAL | Status: DC

## 2014-10-16 MED ORDER — AZITHROMYCIN 250 MG PO TABS: ORAL_TABLET | ORAL | Status: DC

## 2014-10-16 NOTE — Patient Instructions (Signed)
We will send in a medication called Flonase which is a. It has a steroid in it and it helps to dry up her sinuses. Use 2 puffs in each side of your nose every day for the next 2 weeks.  In addition we will send in an antibiotic called a azithromycin. On the first day take 2 pills, days 2 through 5 take 1 pill daily.   We hope that you're feeling better before your trip.

## 2014-10-16 NOTE — Progress Notes (Signed)
Pre visit review using our clinic review tool, if applicable. No additional management support is needed unless otherwise documented below in the visit note. 

## 2014-10-17 DIAGNOSIS — J209 Acute bronchitis, unspecified: Secondary | ICD-10-CM | POA: Insufficient documentation

## 2014-10-17 NOTE — Assessment & Plan Note (Signed)
Rx for Zithromax and Flonase.

## 2014-10-17 NOTE — Progress Notes (Signed)
   Subjective:    Patient ID: Matthew Morris, male    DOB: 1986/06/02, 28 y.o.   MRN: [id number]  HPI The patient is a 28 year old man who comes in today for acute cough with sputum production. He states that he's had nasal congestion and drainage over the past week. He denies fevers but has had some chills. He has had sinus tenderness however this is improving. He states the sputum is green in color and slightly yellow at times. It was originally clear. Denies any ear pain or drainage.  Review of Systems  Constitutional: Positive for chills. Negative for fever, activity change, appetite change, fatigue and unexpected weight change.  HENT: Positive for congestion and postnasal drip. Negative for ear discharge, ear pain, facial swelling, rhinorrhea, sinus pressure, sore throat, tinnitus and trouble swallowing.   Eyes: Negative.   Respiratory: Positive for cough. Negative for chest tightness, shortness of breath and wheezing.   Cardiovascular: Negative for chest pain, palpitations and leg swelling.  Gastrointestinal: Negative.       Objective:   Physical Exam  Constitutional: He appears well-developed and well-nourished.  HENT:  Head: Normocephalic and atraumatic.  Right Ear: External ear normal.  Left Ear: External ear normal.  Oropharynx with erythema, no purulent drainage. Nose with yellow crusting.  Eyes: EOM are normal.  Neck: Normal range of motion.  Cardiovascular: Normal rate and regular rhythm.   Pulmonary/Chest: Effort normal and breath sounds normal. No respiratory distress. He has no wheezes. He has no rales.  Abdominal: Soft. Bowel sounds are normal. He exhibits no distension. There is no tenderness. There is no rebound.   Filed Vitals:   10/16/14 1617  BP: 136/84  Pulse: 80  Temp: 98.3 F (36.8 C)  TempSrc: Oral  Resp: 16  Height: 5\' 8"  (1.727 m)  Weight: 167 lb 12.8 oz (76.114 kg)  SpO2: 97%      Assessment & Plan:

## 2014-12-17 ENCOUNTER — Telehealth: Payer: Self-pay | Admitting: *Deleted

## 2014-12-17 DIAGNOSIS — F909 Attention-deficit hyperactivity disorder, unspecified type: Secondary | ICD-10-CM

## 2014-12-17 MED ORDER — AMPHETAMINE-DEXTROAMPHET ER 20 MG PO CP24: 20.0000 mg | ORAL_CAPSULE | ORAL | Status: DC

## 2014-12-17 NOTE — Telephone Encounter (Signed)
Left msg on triage requesting refill on his adderral.../lmb

## 2014-12-17 NOTE — Telephone Encounter (Signed)
OK X 1 (FYI ,I'm not here this afternoon)

## 2014-12-17 NOTE — Telephone Encounter (Signed)
Called pt inform him md did ok rx but he left without signing scipt. Can come pick rx up tomorrow once md sign...Matthew Morris

## 2014-12-18 NOTE — Telephone Encounter (Signed)
MD sign place in cabinet for pick-up,,,/lmb

## 2015-01-12 ENCOUNTER — Ambulatory Visit (INDEPENDENT_AMBULATORY_CARE_PROVIDER_SITE_OTHER): Payer: BLUE CROSS/BLUE SHIELD | Admitting: Internal Medicine

## 2015-01-12 DIAGNOSIS — F909 Attention-deficit hyperactivity disorder, unspecified type: Secondary | ICD-10-CM

## 2015-01-12 DIAGNOSIS — F17200 Nicotine dependence, unspecified, uncomplicated: Secondary | ICD-10-CM

## 2015-01-12 DIAGNOSIS — Z720 Tobacco use: Secondary | ICD-10-CM

## 2015-01-12 MED ORDER — AMPHETAMINE-DEXTROAMPHET ER 20 MG PO CP24: 20.0000 mg | ORAL_CAPSULE | ORAL | Status: DC

## 2015-01-12 NOTE — Patient Instructions (Signed)
Keep up the good work in cutting back on the cigarettes. It's hard work but really important to decrease your risk of cancer and heart disease in the future.   Smoking Cessation, Tips for Success If you are ready to quit smoking, congratulations! You have chosen to help yourself be healthier. Cigarettes bring nicotine, tar, carbon monoxide, and other irritants into your body. Your lungs, heart, and blood vessels will be able to work better without these poisons. There are many different ways to quit smoking. Nicotine gum, nicotine patches, a nicotine inhaler, or nicotine nasal spray can help with physical craving. Hypnosis, support groups, and medicines help break the habit of smoking. WHAT THINGS CAN I DO TO MAKE QUITTING EASIER?  Here are some tips to help you quit for good:  Pick a date when you will quit smoking completely. Tell all of your friends and family about your plan to quit on that date.  Do not try to slowly cut down on the number of cigarettes you are smoking. Pick a quit date and quit smoking completely starting on that day.  Throw away all cigarettes.   Clean and remove all ashtrays from your home, work, and car.  On a card, write down your reasons for quitting. Carry the card with you and read it when you get the urge to smoke.  Cleanse your body of nicotine. Drink enough water and fluids to keep your urine clear or pale yellow. Do this after quitting to flush the nicotine from your body.  Learn to predict your moods. Do not let a bad situation be your excuse to have a cigarette. Some situations in your life might tempt you into wanting a cigarette.  Never have "just one" cigarette. It leads to wanting another and another. Remind yourself of your decision to quit.  Change habits associated with smoking. If you smoked while driving or when feeling stressed, try other activities to replace smoking. Stand up when drinking your coffee. Brush your teeth after eating. Sit in a  different chair when you read the paper. Avoid alcohol while trying to quit, and try to drink fewer caffeinated beverages. Alcohol and caffeine may urge you to smoke.  Avoid foods and drinks that can trigger a desire to smoke, such as sugary or spicy foods and alcohol.  Ask people who smoke not to smoke around you.  Have something planned to do right after eating or having a cup of coffee. For example, plan to take a walk or exercise.  Try a relaxation exercise to calm you down and decrease your stress. Remember, you may be tense and nervous for the first 2 weeks after you quit, but this will pass.  Find new activities to keep your hands busy. Play with a pen, coin, or rubber band. Doodle or draw things on paper.  Brush your teeth right after eating. This will help cut down on the craving for the taste of tobacco after meals. You can also try mouthwash.   Use oral substitutes in place of cigarettes. Try using lemon drops, carrots, cinnamon sticks, or chewing gum. Keep them handy so they are available when you have the urge to smoke.  When you have the urge to smoke, try deep breathing.  Designate your home as a nonsmoking area.  If you are a heavy smoker, ask your health care provider about a prescription for nicotine chewing gum. It can ease your withdrawal from nicotine.  Reward yourself. Set aside the cigarette money you save and  buy yourself something nice.  Look for support from others. Join a support group or smoking cessation program. Ask someone at home or at work to help you with your plan to quit smoking.  Always ask yourself, "Do I need this cigarette or is this just a reflex?" Tell yourself, "Today, I choose not to smoke," or "I do not want to smoke." You are reminding yourself of your decision to quit.  Do not replace cigarette smoking with electronic cigarettes (commonly called e-cigarettes). The safety of e-cigarettes is unknown, and some may contain harmful  chemicals.  If you relapse, do not give up! Plan ahead and think about what you will do the next time you get the urge to smoke. HOW WILL I FEEL WHEN I QUIT SMOKING? You may have symptoms of withdrawal because your body is used to nicotine (the addictive substance in cigarettes). You may crave cigarettes, be irritable, feel very hungry, cough often, get headaches, or have difficulty concentrating. The withdrawal symptoms are only temporary. They are strongest when you first quit but will go away within 10-14 days. When withdrawal symptoms occur, stay in control. Think about your reasons for quitting. Remind yourself that these are signs that your body is healing and getting used to being without cigarettes. Remember that withdrawal symptoms are easier to treat than the major diseases that smoking can cause.  Even after the withdrawal is over, expect periodic urges to smoke. However, these cravings are generally short lived and will go away whether you smoke or not. Do not smoke! WHAT RESOURCES ARE AVAILABLE TO HELP ME QUIT SMOKING? Your health care provider can direct you to community resources or hospitals for support, which may include:  Group support.  Education.  Hypnosis.  Therapy. Document Released: 07/29/2004 Document Revised: 03/17/2014 Document Reviewed: 04/18/2013 [facility] Patient Information 2015 [place], [place]. This information is not intended to replace advice given to you by your health care provider. Make sure you discuss any questions you have with your health care provider.

## 2015-01-12 NOTE — Progress Notes (Signed)
Pre visit review using our clinic review tool, if applicable. No additional management support is needed unless otherwise documented below in the visit note. 

## 2015-01-13 ENCOUNTER — Encounter: Payer: Self-pay | Admitting: Internal Medicine

## 2015-01-13 NOTE — Progress Notes (Signed)
   Subjective:    Patient ID: Matthew Morris, male    DOB: 10-19-1986, 29 y.o.   MRN: [id number]  HPI The patient is a 29 YO man who is coming in to follow up on his smoking. His has tried to quit cold [place] in the past without good success (he quit for several months). He has been trying to cut back on number of cigarettes. He has an app for his phone and logs all his smoking. He is down to about 4 cigarettes per day. His goal is to stop smoking altogether. He has been smoking for many years.   Review of Systems  Constitutional: Negative for fever, activity change, appetite change and fatigue.  HENT: Negative.   Eyes: Negative.   Respiratory: Negative for cough, chest tightness, shortness of breath and wheezing.   Cardiovascular: Negative for chest pain, palpitations and leg swelling.  Gastrointestinal: Negative for abdominal pain, diarrhea, constipation and abdominal distention.  Endocrine: Negative.   Genitourinary: Negative.   Musculoskeletal: Negative.   Skin: Negative.   Allergic/Immunologic: Negative.   Neurological: Negative.   Psychiatric/Behavioral: Negative.       Objective:   Physical Exam  Constitutional: He is oriented to person, place, and time. He appears well-developed and well-nourished. No distress.  HENT:  Head: Normocephalic and atraumatic.  Eyes: EOM are normal.  Neck: Normal range of motion.  Cardiovascular: Normal rate and regular rhythm.   No murmur heard. Pulmonary/Chest: Effort normal and breath sounds normal. No respiratory distress. He has no wheezes. He has no rales.  Abdominal: Soft. Bowel sounds are normal. He exhibits no distension. There is no tenderness. There is no rebound.  Neurological: He is alert and oriented to person, place, and time. Coordination normal.  Skin: Skin is warm and dry.  Vitals reviewed.     Assessment & Plan:

## 2015-01-13 NOTE — Assessment & Plan Note (Signed)
Has been smoking about 15 years, now down to 4 cigarettes per day. Encouraged him to continue cutting back. His goal is to quit. Talked with him about the risks and harms from cigarette smoke.

## 2015-03-24 ENCOUNTER — Ambulatory Visit (INDEPENDENT_AMBULATORY_CARE_PROVIDER_SITE_OTHER): Payer: BLUE CROSS/BLUE SHIELD | Admitting: Internal Medicine

## 2015-03-24 ENCOUNTER — Encounter: Payer: Self-pay | Admitting: Internal Medicine

## 2015-03-24 VITALS — BP 130/70 | HR 74 | Temp 98.6°F | Resp 16 | Ht 69.0 in | Wt 164.8 lb

## 2015-03-24 DIAGNOSIS — Z720 Tobacco use: Secondary | ICD-10-CM | POA: Diagnosis not present

## 2015-03-24 DIAGNOSIS — F909 Attention-deficit hyperactivity disorder, unspecified type: Secondary | ICD-10-CM | POA: Diagnosis not present

## 2015-03-24 DIAGNOSIS — F17200 Nicotine dependence, unspecified, uncomplicated: Secondary | ICD-10-CM

## 2015-03-24 MED ORDER — AMPHETAMINE-DEXTROAMPHET ER 20 MG PO CP24: 20.0000 mg | ORAL_CAPSULE | ORAL | Status: DC

## 2015-03-24 NOTE — Patient Instructions (Signed)
The thumb will likely continue to heal on its own and nothing special that you need to do with it.   If the toe starts giving you more problems call us back and we will have you see the sports medicine doctor.   Since you are doing well we can see you back in about 6 months. You can come back in about 3 months and pick up another 3 months of prescriptions.

## 2015-03-24 NOTE — Progress Notes (Signed)
Pre visit review using our clinic review tool, if applicable. No additional management support is needed unless otherwise documented below in the visit note. 

## 2015-03-24 NOTE — Progress Notes (Signed)
   Subjective:    Patient ID: Matthew Morris, male    DOB: [birth year], 29 y.o.   MRN: [id number]  HPI The patient is a 29 YO man who is coming in for follow up of his ADHD. He is taking the medicine as prescribed and sometimes does not take it during the weekend. Able to do his work and focus. He is still working on quitting cigarettes. He is using an app on his phone and is working on going down slowly on dosing. He does not want any assistance with quitting.   Review of Systems  Constitutional: Negative for fever, activity change, appetite change and fatigue.  Respiratory: Negative for cough, chest tightness, shortness of breath and wheezing.   Cardiovascular: Negative for chest pain, palpitations and leg swelling.  Gastrointestinal: Negative for abdominal pain, diarrhea, constipation and abdominal distention.  Musculoskeletal: Negative.       Objective:   Physical Exam  Constitutional: He is oriented to person, place, and time. He appears well-developed and well-nourished. No distress.  HENT:  Head: Normocephalic and atraumatic.  Eyes: EOM are normal.  Neck: Normal range of motion.  Cardiovascular: Normal rate and regular rhythm.   No murmur heard. Pulmonary/Chest: Effort normal and breath sounds normal. No respiratory distress. He has no wheezes. He has no rales.  Abdominal: Soft. Bowel sounds are normal. He exhibits no distension. There is no tenderness. There is no rebound.  Neurological: He is alert and oriented to person, place, and time. Coordination normal.  Skin: Skin is warm and dry.   Filed Vitals:   03/24/15 1324  BP: 130/70  Pulse: 74  Temp: 98.6 F (37 C)  TempSrc: Oral  Resp: 16  Height: 5\' 9"  (1.753 m)  Weight: 164 lb 12.8 oz (74.753 kg)  SpO2: 97%      Assessment & Plan:

## 2015-03-26 NOTE — Assessment & Plan Note (Signed)
Refill given for 3 months of prescriptions. He is not using on the weekends. We talked again about the unknown risks of long term use of such medications and that he may want to start trying to wean down if he is able. He does not feel able to do that right now.

## 2015-03-26 NOTE — Assessment & Plan Note (Signed)
Still smoking about 8 cigs per day, unclear that he is making progress with the app on his phone but encouraged him to work on quitting. He knows that he needs to quit.

## 2015-04-03 ENCOUNTER — Encounter: Payer: Self-pay | Admitting: Family

## 2015-04-03 ENCOUNTER — Ambulatory Visit (INDEPENDENT_AMBULATORY_CARE_PROVIDER_SITE_OTHER): Payer: BLUE CROSS/BLUE SHIELD | Admitting: Family

## 2015-04-03 VITALS — BP 124/90 | HR 80 | Temp 97.7°F | Resp 18 | Ht 69.0 in | Wt 162.6 lb

## 2015-04-03 DIAGNOSIS — T148 Other injury of unspecified body region: Secondary | ICD-10-CM

## 2015-04-03 DIAGNOSIS — W57XXXA Bitten or stung by nonvenomous insect and other nonvenomous arthropods, initial encounter: Secondary | ICD-10-CM | POA: Diagnosis not present

## 2015-04-03 MED ORDER — DOXYCYCLINE HYCLATE 100 MG PO TABS: 100.0000 mg | ORAL_TABLET | Freq: Two times a day (BID) | ORAL | Status: DC

## 2015-04-03 NOTE — Assessment & Plan Note (Signed)
Exam consistent with tick bite. Given increased joint pains and stiffness in neck, start doxycycline and prophylactically treat for Lyme disease. Continue to monitor for further development of disease. Patient given handout of information to observe for. Follow-up if symptoms worsen or fail to improve.

## 2015-04-03 NOTE — Patient Instructions (Addendum)
Thank you for choosing [facility].  Summary/Instructions:  Your prescription(s) have been submitted to your pharmacy or been printed and provided for you. Please take as directed and contact our office if you believe you are having problem(s) with the medication(s) or have any questions.  If your symptoms worsen or fail to improve, please contact our office for further instruction, or in case of emergency go directly to the emergency room at the closest medical facility.   Lyme Disease You may have been bitten by a tick and are to watch for the development of Lyme Disease. Lyme Disease is an infection that is caused by a bacteria The bacteria causing this disease is named Borreilia burgdorferi. If a tick is infected with this bacteria and then bites you, then Lyme Disease may occur. These ticks are carried by deer and rodents such as rabbits and mice and infest grassy as well as forested areas. Fortunately most tick bites do not cause Lyme Disease.  Lyme Disease is easier to prevent than to treat. First, covering your legs with clothing when walking in areas where ticks are possibly abundant will prevent their attachment because ticks tend to stay within inches of the ground. Second, using insecticides containing DEET can be applied on skin or clothing. Last, because it takes about 12 to 24 hours for the tick to transmit the disease after attachment to the human host, you should inspect your body for ticks twice a day when you are in areas where Lyme Disease is common. You must look thoroughly when searching for ticks. The Ixodes tick that carries Lyme Disease is very small. It is around the size of a sesame seed (picture of tick is not actual size). Removal is best done by grasping the tick by the head and pulling it out. Do not to squeeze the body of the tick. This could inject the infecting bacteria into the bite site. Wash the area of the bite with an antiseptic solution after removal.  Lyme  Disease is a disease that may affect many body systems. Because of the small size of the biting tick, most people do not notice being bitten. The first sign of an infection is usually a round red rash that extends out from the center of the tick bite. The center of the lesion may be blood colored (hemorrhagic) or have tiny blisters (vesicular). Most lesions have bright red outer borders and partial central clearing. This rash may extend out many inches in diameter, and multiple lesions may be present. Other symptoms such as fatigue, headaches, chills and fever, general achiness and swelling of lymph glands may also occur. If this first stage of the disease is left untreated, these symptoms may gradually resolve by themselves, or progressive symptoms may occur because of spread of infection to other areas of the body.  Follow up with your caregiver to have testing and treatment if you have a tick bite and you develop any of the above complaints. Your caregiver may recommend preventative (prophylactic) medications which kill bacteria (antibiotics). Once a diagnosis of Lyme Disease is made, antibiotic treatment is highly likely to cure the disease. Effective treatment of late stage Lyme Disease may require longer courses of antibiotic therapy.  MAKE SURE YOU:   Understand these instructions.  Will watch your condition.  Will get help right away if you are not doing well or get worse. Document Released: 02/06/2001 Document Revised: 01/23/2012 Document Reviewed: 04/10/2009 [facility] Patient Information 2015 [place], [place]. This information is not intended  to replace advice given to you by your health care provider. Make sure you discuss any questions you have with your health care provider.

## 2015-04-03 NOTE — Progress Notes (Signed)
Pre visit review using our clinic review tool, if applicable. No additional management support is needed unless otherwise documented below in the visit note. 

## 2015-04-03 NOTE — Progress Notes (Signed)
   Subjective:    Patient ID: Matthew Morris, male    DOB: 1986/04/02, 29 y.o.   MRN: [id number]  Chief Complaint  Patient presents with  . tick bite    x1 week he is not sure that he got all of the tick out, the bite does itch and is kinda hard around it, neck pain started yesterday and has been feeling more tired and just wants to make sure he is ok,      HPI:  Matthew Morris is a 29 y.o. male with a PMH of tobacco abuse who presents today for an acute office visit.   1) Tick bite - Associated symptom of a tick bite located on the posterior aspect of his left knee has been going on for about 1 week. Believes that the tick may have been there for about a max of about 24 hours with a small potential possibly before. Modifying factors include calamine lotion which did not help very much. Indicates that the site was inflammed at first and has gone down some. Denies any rashes similar to erythema migrans. Denies any flu like symptoms. Also notes a significant stiff neck that started about a day ago which is described as worse than the average sleeping wrong.    Allergies  Allergen Reactions  . Chantix [Varenicline]     Somber, dark mood Anhedonia  . Ritalin [Methylphenidate Hcl]     A norexia    Current Outpatient Prescriptions on File Prior to Visit  Medication Sig Dispense Refill  . amphetamine-dextroamphetamine (ADDERALL XR) 20 MG 24 hr capsule Take 1 capsule (20 mg total) by mouth every morning. 30 capsule 0   No current facility-administered medications on file prior to visit.    Review of Systems  Constitutional: Negative for fever and chills.  Musculoskeletal: Positive for arthralgias (shoulders) and neck stiffness.  Skin: Positive for wound.      Objective:    BP 124/90 mmHg  Pulse 80  Temp(Src) 97.7 F (36.5 C) (Oral)  Resp 18  Ht 5\' 9"  (1.753 m)  Wt 162 lb 9.6 oz (73.755 kg)  BMI 24.00 kg/m2  SpO2 99% Nursing note and vital signs reviewed.  Physical Exam    Constitutional: He is oriented to person, place, and time. He appears well-developed and well-nourished. No distress.  Cardiovascular: Normal rate, regular rhythm, normal heart sounds and intact distal pulses.   Pulmonary/Chest: Effort normal and breath sounds normal.  Neurological: He is alert and oriented to person, place, and time.  Skin: Skin is warm and dry.     Psychiatric: He has a normal mood and affect. His behavior is normal. Judgment and thought content normal.       Assessment & Plan:

## 2015-04-28 ENCOUNTER — Encounter: Payer: Self-pay | Admitting: Internal Medicine

## 2015-05-14 ENCOUNTER — Encounter: Payer: Self-pay | Admitting: Internal Medicine

## 2015-06-26 ENCOUNTER — Encounter: Payer: Self-pay | Admitting: Family

## 2015-06-26 ENCOUNTER — Ambulatory Visit (INDEPENDENT_AMBULATORY_CARE_PROVIDER_SITE_OTHER): Payer: BLUE CROSS/BLUE SHIELD | Admitting: Family

## 2015-06-26 VITALS — BP 136/84 | HR 84 | Temp 98.6°F | Resp 18 | Ht 69.0 in | Wt 161.0 lb

## 2015-06-26 DIAGNOSIS — F909 Attention-deficit hyperactivity disorder, unspecified type: Secondary | ICD-10-CM | POA: Diagnosis not present

## 2015-06-26 DIAGNOSIS — T672XXA Heat cramp, initial encounter: Secondary | ICD-10-CM | POA: Diagnosis not present

## 2015-06-26 MED ORDER — AMPHETAMINE-DEXTROAMPHET ER 20 MG PO CP24: 20.0000 mg | ORAL_CAPSULE | ORAL | Status: DC

## 2015-06-26 NOTE — Progress Notes (Signed)
Subjective:    Patient ID: Matthew Morris, male    DOB: May 25, 1986, 29 y.o.   MRN: [id number]  Chief Complaint  Patient presents with  . Medication Refill    had right sided rib pain during a soccer game yesterday, was wanting to get some feedback, adderall refill    HPI:  Matthew Morris is a 29 y.o. male with a PMH of ADHD, tobacco abuse and sebaceous cyst who presents today for an office follow up.  1.) ADHD - Currently maintained on Adderall XR 20 mg. Reports his attention is adequately controlled. Takes the medications on weekdays mainly and denies adverse side effects including sleep disturbance, decreased appetite, weight loss, headaches, hypertension, or heart palpitations. Currently takes the   [facility] Readings from Last 3 Encounters:  06/26/15 161 lb (73.029 kg)  04/03/15 162 lb 9.6 oz (73.755 kg)  03/24/15 164 lb 12.8 oz (74.753 kg)    2.) Right sided pain - this is a new problem. Associated symptom of pain located on his right side started about a day ago following playing soccer for about 45 minutes. Indicates that he hydrated the best he could. Describes the pain as sharp and located around the area of his liver. Felt some improvement with bending over. Other modifying factors include resting that resolved the pain. Not currently experiencing any pain   Allergies  Allergen Reactions  . Chantix [Varenicline]     Somber, dark mood Anhedonia  . Ritalin [Methylphenidate Hcl]     A norexia    No current outpatient prescriptions on file prior to visit.   No current facility-administered medications on file prior to visit.     Review of Systems  Constitutional: Negative for diaphoresis, appetite change and unexpected weight change.  Respiratory: Negative for chest tightness and shortness of breath.   Cardiovascular: Negative for chest pain, palpitations and leg swelling.  Gastrointestinal: Negative for nausea, vomiting, abdominal pain, diarrhea and constipation.    Neurological: Negative for weakness, light-headedness and headaches.  Psychiatric/Behavioral: Negative for sleep disturbance and decreased concentration. The patient is not nervous/anxious.       Objective:    BP 136/84 mmHg  Pulse 84  Temp(Src) 98.6 F (37 C) (Oral)  Resp 18  Ht 5\' 9"  (1.753 m)  Wt 161 lb (73.029 kg)  BMI 23.76 kg/m2  SpO2 99% Nursing note and vital signs reviewed.  Physical Exam  Constitutional: He is oriented to person, place, and time. He appears well-developed and well-nourished. No distress.  Cardiovascular: Normal rate, regular rhythm, normal heart sounds and intact distal pulses.   Pulmonary/Chest: Effort normal and breath sounds normal.  Abdominal: Normal appearance and bowel sounds are normal. He exhibits no mass. There is no hepatosplenomegaly. There is no tenderness. There is no rigidity, no rebound, no guarding, no tenderness at McBurney's point and negative Murphy's sign.  Musculoskeletal:  Right side - no obvious deformity, discoloration, or edema of right side noted or ribs. No palpable tenderness able to be elicited. Negative rib compression test.  Neurological: He is alert and oriented to person, place, and time.  Skin: Skin is warm and dry.  Psychiatric: He has a normal mood and affect. His behavior is normal. Judgment and thought content normal.       Assessment & Plan:   Problem List Items Addressed This Visit      Other   Attention deficit hyperactivity disorder (ADHD) - Primary    Attention is adequately controlled with current dosage of Adderall.  Denies adverse side effects. Discussed importance of decreasing caffeine intake while taking Adderall secondary to increased blood pressure and heart rate. Continue current dosage of Adderall. Follow-up with PCP for continued care.      Relevant Medications   amphetamine-dextroamphetamine (ADDERALL XR) 20 MG 24 hr capsule   Heat cramp    Symptoms described as likely related to heat cramps  which have since resolved. Continue to hydrate well and monitor at this time. Follow-up if symptoms return.

## 2015-06-26 NOTE — Patient Instructions (Signed)
Thank you for choosing [facility].  Summary/Instructions:  Your prescription(s) have been submitted to your pharmacy or been printed and provided for you. Please take as directed and contact our office if you believe you are having problem(s) with the medication(s) or have any questions.  If your symptoms worsen or fail to improve, please contact our office for further instruction, or in case of emergency go directly to the emergency room at the closest medical facility.    Muscle Cramps and Spasms Muscle cramps and spasms occur when a muscle or muscles tighten and you have no control over this tightening (involuntary muscle contraction). They are a common problem and can develop in any muscle. The most common place is in the calf muscles of the leg. Both muscle cramps and muscle spasms are involuntary muscle contractions, but they also have differences:   Muscle cramps are sporadic and painful. They may last a few seconds to a quarter of an hour. Muscle cramps are often more forceful and last longer than muscle spasms.  Muscle spasms may or may not be painful. They may also last just a few seconds or much longer. CAUSES  It is uncommon for cramps or spasms to be due to a serious underlying problem. In many cases, the cause of cramps or spasms is unknown. Some common causes are:   Overexertion.   Overuse from repetitive motions (doing the same thing over and over).   Remaining in a certain position for a long period of time.   Improper preparation, form, or technique while performing a sport or activity.   Dehydration.   Injury.   Side effects of some medicines.   Abnormally low levels of the salts and ions in your blood (electrolytes), especially potassium and calcium. This could happen if you are taking water pills (diuretics) or you are pregnant.  Some underlying medical problems can make it more likely to develop cramps or spasms. These include, but are not  limited to:   Diabetes.   Parkinson disease.   Hormone disorders, such as thyroid problems.   Alcohol abuse.   Diseases specific to muscles, joints, and bones.   Blood vessel disease where not enough blood is getting to the muscles.  HOME CARE INSTRUCTIONS   Stay well hydrated. Drink enough water and fluids to keep your urine clear or pale yellow.  It may be helpful to massage, stretch, and relax the affected muscle.  For tight or tense muscles, use a warm towel, heating pad, or hot shower water directed to the affected area.  If you are sore or have pain after a cramp or spasm, applying ice to the affected area may relieve discomfort.  Put ice in a plastic bag.  Place a towel between your skin and the bag.  Leave the ice on for 15-20 minutes, 03-04 times a day.  Medicines used to treat a known cause of cramps or spasms may help reduce their frequency or severity. Only take over-the-counter or prescription medicines as directed by your caregiver. SEEK MEDICAL CARE IF:  Your cramps or spasms get more severe, more frequent, or do not improve over time.  MAKE SURE YOU:   Understand these instructions.  Will watch your condition.  Will get help right away if you are not doing well or get worse. Document Released: 04/22/2002 Document Revised: 02/25/2013 Document Reviewed: 10/17/2012 [facility] Patient Information 2015 [place], [place]. This information is not intended to replace advice given to you by your health care provider. Make sure  you discuss any questions you have with your health care provider.

## 2015-06-26 NOTE — Assessment & Plan Note (Signed)
Attention is adequately controlled with current dosage of Adderall. Denies adverse side effects. Discussed importance of decreasing caffeine intake while taking Adderall secondary to increased blood pressure and heart rate. Continue current dosage of Adderall. Follow-up with PCP for continued care.

## 2015-06-26 NOTE — Progress Notes (Signed)
Pre visit review using our clinic review tool, if applicable. No additional management support is needed unless otherwise documented below in the visit note. 

## 2015-06-26 NOTE — Assessment & Plan Note (Signed)
Symptoms described as likely related to heat cramps which have since resolved. Continue to hydrate well and monitor at this time. Follow-up if symptoms return.

## 2015-08-05 ENCOUNTER — Telehealth: Payer: Self-pay | Admitting: Internal Medicine

## 2015-08-05 DIAGNOSIS — F909 Attention-deficit hyperactivity disorder, unspecified type: Secondary | ICD-10-CM

## 2015-08-05 NOTE — Telephone Encounter (Signed)
Patient requesting refill for amphetamine-dextroamphetamine (ADDERALL XR) 20 MG 24 hr capsule [[id number]]

## 2015-08-06 ENCOUNTER — Other Ambulatory Visit: Payer: Self-pay | Admitting: Family

## 2015-08-06 DIAGNOSIS — F909 Attention-deficit hyperactivity disorder, unspecified type: Secondary | ICD-10-CM

## 2015-08-06 MED ORDER — AMPHETAMINE-DEXTROAMPHET ER 20 MG PO CP24: 20.0000 mg | ORAL_CAPSULE | ORAL | Status: DC

## 2015-08-06 NOTE — Telephone Encounter (Signed)
Printed and signed.  

## 2015-09-16 ENCOUNTER — Telehealth: Payer: Self-pay | Admitting: *Deleted

## 2015-09-16 DIAGNOSIS — F909 Attention-deficit hyperactivity disorder, unspecified type: Secondary | ICD-10-CM

## 2015-09-16 NOTE — Telephone Encounter (Signed)
Receive call pt requesting refill on his adderral.../lmb

## 2015-09-17 MED ORDER — AMPHETAMINE-DEXTROAMPHET ER 20 MG PO CP24: 20.0000 mg | ORAL_CAPSULE | ORAL | Status: DC

## 2015-09-17 NOTE — Telephone Encounter (Signed)
Notified pt rx ready for pick-up.../lmb 

## 2015-09-17 NOTE — Telephone Encounter (Signed)
Printed and signed.  

## 2016-01-26 ENCOUNTER — Telehealth: Payer: Self-pay | Admitting: Internal Medicine

## 2016-01-26 DIAGNOSIS — F909 Attention-deficit hyperactivity disorder, unspecified type: Secondary | ICD-10-CM

## 2016-01-26 MED ORDER — AMPHETAMINE-DEXTROAMPHET ER 20 MG PO CP24: 20.0000 mg | ORAL_CAPSULE | ORAL | Status: DC

## 2016-01-26 NOTE — Telephone Encounter (Signed)
Printed and signed.  

## 2016-01-26 NOTE — Telephone Encounter (Signed)
Faxed to pharmacy

## 2016-01-26 NOTE — Telephone Encounter (Signed)
Is requesting refill on adderall.  °

## 2016-02-04 ENCOUNTER — Ambulatory Visit: Payer: BLUE CROSS/BLUE SHIELD | Admitting: Internal Medicine

## 2016-02-08 ENCOUNTER — Ambulatory Visit (INDEPENDENT_AMBULATORY_CARE_PROVIDER_SITE_OTHER): Payer: BLUE CROSS/BLUE SHIELD | Admitting: Family

## 2016-02-08 ENCOUNTER — Encounter: Payer: Self-pay | Admitting: Family

## 2016-02-08 VITALS — BP 120/78 | HR 68 | Temp 98.5°F | Resp 16 | Ht 69.0 in | Wt 162.8 lb

## 2016-02-08 DIAGNOSIS — F909 Attention-deficit hyperactivity disorder, unspecified type: Secondary | ICD-10-CM | POA: Diagnosis not present

## 2016-02-08 MED ORDER — AMPHETAMINE-DEXTROAMPHET ER 20 MG PO CP24: 20.0000 mg | ORAL_CAPSULE | ORAL | Status: DC

## 2016-02-08 NOTE — Patient Instructions (Signed)
Thank you for choosing [facility].  Summary/Instructions:  Please continue to take your medications as prescribed.  Your prescription(s) have been submitted to your pharmacy or been printed and provided for you. Please take as directed and contact our office if you believe you are having problem(s) with the medication(s) or have any questions.

## 2016-02-08 NOTE — Progress Notes (Signed)
Pre visit review using our clinic review tool, if applicable. No additional management support is needed unless otherwise documented below in the visit note. 

## 2016-02-08 NOTE — Progress Notes (Signed)
   Subjective:    Patient ID: Matthew Morris, male    DOB: 1986-02-10, 30 y.o.   MRN: [id number]  Chief Complaint  Patient presents with  . Medication Management    Refill of adderall wants to see about 3 seperate rxs    HPI:  Matthew Morris is a 30 y.o. male who  has a past medical history of ADD (attention deficit disorder); TOBACCO ABUSE; and Sebaceous cyst. and presents today for an office follow up.   ADHD - Currently managed on Adderall XR. Reports taking the medication as prescribed and denies adverse side effects. Reports his symptoms are well controlled with the medication and is able to concentrate and focus. Averaging about 7-8 hours of sleep per night. No appetite changes or with no significant weight loss. Has been going to the gym more frequently. No chest pain, shortness of breath or chest palpitations.   BP Readings from Last 3 Encounters:  02/08/16 120/78  06/26/15 136/84  04/03/15 124/90   Wt Readings from Last 3 Encounters:  02/08/16 162 lb 12.8 oz (73.846 kg)  06/26/15 161 lb (73.029 kg)  04/03/15 162 lb 9.6 oz (73.755 kg)     Allergies  Allergen Reactions  . Chantix [Varenicline]     Somber, dark mood Anhedonia  . Ritalin [Methylphenidate Hcl]     A norexia     No current outpatient prescriptions on file prior to visit.   No current facility-administered medications on file prior to visit.    Review of Systems  Constitutional: Negative for diaphoresis, appetite change and unexpected weight change.  Respiratory: Negative for chest tightness and shortness of breath.   Cardiovascular: Negative for chest pain, palpitations and leg swelling.  Neurological: Negative for headaches.  Psychiatric/Behavioral: Negative for sleep disturbance and decreased concentration. The patient is not nervous/anxious.       Objective:    BP 120/78 mmHg  Pulse 68  Temp(Src) 98.5 F (36.9 C) (Oral)  Resp 16  Ht 5\' 9"  (1.753 m)  Wt 162 lb 12.8 oz (73.846 kg)  BMI  24.03 kg/m2  SpO2 96% Nursing note and vital signs reviewed.  Physical Exam  Constitutional: He is oriented to person, place, and time. He appears well-developed and well-nourished. No distress.  Cardiovascular: Normal rate, regular rhythm, normal heart sounds and intact distal pulses.   Pulmonary/Chest: Effort normal and breath sounds normal.  Neurological: He is alert and oriented to person, place, and time.  Skin: Skin is warm and dry.  Psychiatric: He has a normal mood and affect. His behavior is normal. Judgment and thought content normal.       Assessment & Plan:   Problem List Items Addressed This Visit      Other   Attention deficit hyperactivity disorder (ADHD) - Primary    ADHD adequately controlled with current regimen of Adderall XR. No adverse side effects with good sleep, appetite, and no significant weight changes. Continue current dosage of Adderall XR. [place] controlled substance database reviewed with no irregularities. Previous prescription written on 01/26/16 was turned into the pharmacy but never picked up. Follow-up in 3 months or sooner if needed.      Relevant Medications   amphetamine-dextroamphetamine (ADDERALL XR) 20 MG 24 hr capsule

## 2016-02-08 NOTE — Assessment & Plan Note (Signed)
ADHD adequately controlled with current regimen of Adderall XR. No adverse side effects with good sleep, appetite, and no significant weight changes. Continue current dosage of Adderall XR. [place] controlled substance database reviewed with no irregularities. Previous prescription written on 01/26/16 was turned into the pharmacy but never picked up. Follow-up in 3 months or sooner if needed.

## 2016-03-10 ENCOUNTER — Ambulatory Visit (INDEPENDENT_AMBULATORY_CARE_PROVIDER_SITE_OTHER): Payer: BLUE CROSS/BLUE SHIELD | Admitting: Family

## 2016-03-10 ENCOUNTER — Encounter: Payer: Self-pay | Admitting: Family

## 2016-03-10 VITALS — BP 140/80 | HR 67 | Temp 98.3°F | Ht 69.0 in | Wt 151.0 lb

## 2016-03-10 DIAGNOSIS — J209 Acute bronchitis, unspecified: Secondary | ICD-10-CM | POA: Diagnosis not present

## 2016-03-10 DIAGNOSIS — R05 Cough: Secondary | ICD-10-CM | POA: Diagnosis not present

## 2016-03-10 DIAGNOSIS — R059 Cough, unspecified: Secondary | ICD-10-CM

## 2016-03-10 MED ORDER — PROMETHAZINE-DM 6.25-15 MG/5ML PO SYRP: 5.0000 mL | ORAL_SOLUTION | Freq: Every evening | ORAL | Status: DC | PRN

## 2016-03-10 NOTE — Progress Notes (Signed)
Subjective:    Patient ID: Matthew Morris, male    DOB: February 06, 1986, 30 y.o.   MRN: [id number]   Matthew Morris is a 30 y.o. male who presents today for an acute visit.    HPI Comments: Traveled to [place], [place] and just got back this week from 3 week trip.     Cough This is a new problem. The current episode started in the past 7 days. The problem has been unchanged. The cough is productive of sputum. Pertinent negatives include no chest pain, chills, ear pain, fever, headaches, myalgias, rhinorrhea, sore throat, shortness of breath or wheezing. Treatments tried: mucinex. There is no history of asthma, COPD, environmental allergies or pneumonia.   Past Medical History  Diagnosis Date  . ADD (attention deficit disorder)   . TOBACCO ABUSE   . Sebaceous cyst    Chantix and Ritalin Current Outpatient Prescriptions on File Prior to Visit  Medication Sig Dispense Refill  . amphetamine-dextroamphetamine (ADDERALL XR) 20 MG 24 hr capsule Take 1 capsule (20 mg total) by mouth every morning. 30 capsule 0   No current facility-administered medications on file prior to visit.    Social History  Substance Use Topics  . Smoking status: Current Every Day Smoker    Types: Cigarettes  . Smokeless tobacco: Never Used     Comment: see 03/26/14  . Alcohol Use: Yes     Comment:  socially    Review of Systems  Constitutional: Negative for fever and chills.  HENT: Positive for congestion. Negative for ear pain, rhinorrhea, sinus pressure and sore throat.   Respiratory: Positive for cough. Negative for shortness of breath and wheezing.   Cardiovascular: Negative for chest pain and palpitations.  Gastrointestinal: Negative for nausea, vomiting and diarrhea.  Musculoskeletal: Negative for myalgias.  Allergic/Immunologic: Negative for environmental allergies.  Neurological: Negative for headaches.      Objective:    BP 140/80 mmHg  Pulse 67  Temp(Src) 98.3 F (36.8 C) (Oral)  Ht 5'  9" (1.753 m)  Wt 151 lb (68.493 kg)  BMI 22.29 kg/m2  SpO2 98%   Physical Exam  Constitutional: Vital signs are normal. He appears well-developed and well-nourished.  HENT:  Head: Normocephalic and atraumatic.  Right Ear: Hearing, tympanic membrane, external ear and ear canal normal. No drainage, swelling or tenderness. Tympanic membrane is not injected, not erythematous and not bulging. No middle ear effusion. No decreased hearing is noted.  Left Ear: Hearing, tympanic membrane, external ear and ear canal normal. No drainage, swelling or tenderness. Tympanic membrane is not injected, not erythematous and not bulging.  No middle ear effusion. No decreased hearing is noted.  Nose: Nose normal. Right sinus exhibits no maxillary sinus tenderness and no frontal sinus tenderness. Left sinus exhibits no maxillary sinus tenderness and no frontal sinus tenderness.  Mouth/Throat: Uvula is midline, oropharynx is clear and moist and mucous membranes are normal. No oropharyngeal exudate, posterior oropharyngeal edema, posterior oropharyngeal erythema or tonsillar abscesses.  Eyes: Conjunctivae are normal.  Cardiovascular: Regular rhythm and normal heart sounds.   Pulmonary/Chest: Effort normal and breath sounds normal. No respiratory distress. He has no wheezes. He has no rhonchi. He has no rales.  Lymphadenopathy:       Head (right side): No submental, no submandibular, no tonsillar, no preauricular, no posterior auricular and no occipital adenopathy present.       Head (left side): No submental, no submandibular, no tonsillar, no preauricular, no posterior auricular and no occipital adenopathy  present.    He has no cervical adenopathy.  Neurological: He is alert.  Skin: Skin is warm and dry.  Psychiatric: He has a normal mood and affect. His speech is normal and behavior is normal.  Vitals reviewed.      Assessment & Plan:   .1. Cough  - promethazine-dextromethorphan (PROMETHAZINE-DM) 6.25-15  MG/5ML syrup; Take 5 mLs by mouth at bedtime as needed for cough.  Dispense: 40 mL; Refill: 1  2. Acute bronchitis, unspecified organism Viral. Evidence of bacterial etiology at this time. Patient agreed on conservative treatment at this time and delayed antibiotics.   I am having Matthew Morris maintain his amphetamine-dextroamphetamine.   No orders of the defined types were placed in this encounter.     Start medications as prescribed and explained to patient on After Visit Summary ( AVS). Risks, benefits, and alternatives of the medications and treatment plan prescribed today were discussed, and patient expressed understanding.   Education regarding symptom management and diagnosis given to patient.   Follow-up:Plan follow-up as discussed or as needed if any worsening symptoms or change in condition. No Follow-up on file.   Continue to follow with Hoyt Koch, MD for routine health maintenance.   Maia Plan and I agreed with plan.   Mable Paris, FNP

## 2016-03-10 NOTE — Progress Notes (Signed)
Pre visit review using our clinic review tool, if applicable. No additional management support is needed unless otherwise documented below in the visit note. 

## 2016-03-10 NOTE — Patient Instructions (Signed)
Please take cough medication at night only as needed. As we discussed, I do not recommend dosing throughout the day as coughing is a protective mechanism . It also helps to break up thick mucous.  Do not take cough suppressants with alcohol as can lead to trouble breathing. Advise caution if taking cough suppressant and operating machinery ( i.e driving a car) as you may feel very tired.    Increase intake of clear fluids. Congestion is best treated by hydration, when mucus is wetter, it is thinner, less sticky, and easier to expel from the body, either through coughing up drainage, or by blowing your nose.   Get plenty of rest.   Use saline nasal drops and blow your nose frequently. Run a humidifier at night and elevate the head of the bed. Vicks Vapor rub will help with congestion and cough. Steam showers and sinus massage for congestion.   Use Acetaminophen or Ibuprofen as needed for fever or pain. Avoid second hand smoke. Even the smallest exposure will worsen symptoms.   Over the counter medications you can try include Delsym for cough, a decongestant for congestion, and Mucinex or Robitussin as an expectorant. Be sure to just get the plain Mucinex or Robitussin that just has one medication (Guaifenesen). We don't recommend the combination products. Note, be sure to drink two glasses of water with each dose of Mucinex as the medication will not work well without adequate hydration.   You can also try a teaspoon of honey to see if this will help reduce cough. Throat lozenges can sometimes be beneficial as well.    This illness will typically last 7 - 10 days.   Please follow up with our clinic if you develop a fever greater than 101 F, symptoms worsen, or do not resolve in the next week.    

## 2016-06-07 DIAGNOSIS — S76212A Strain of adductor muscle, fascia and tendon of left thigh, initial encounter: Secondary | ICD-10-CM | POA: Diagnosis not present

## 2016-06-08 ENCOUNTER — Ambulatory Visit: Payer: BLUE CROSS/BLUE SHIELD | Admitting: Internal Medicine

## 2016-06-16 DIAGNOSIS — S76212D Strain of adductor muscle, fascia and tendon of left thigh, subsequent encounter: Secondary | ICD-10-CM | POA: Diagnosis not present

## 2016-06-16 DIAGNOSIS — R262 Difficulty in walking, not elsewhere classified: Secondary | ICD-10-CM | POA: Diagnosis not present

## 2016-06-16 DIAGNOSIS — M79605 Pain in left leg: Secondary | ICD-10-CM | POA: Diagnosis not present

## 2016-06-21 DIAGNOSIS — R262 Difficulty in walking, not elsewhere classified: Secondary | ICD-10-CM | POA: Diagnosis not present

## 2016-06-21 DIAGNOSIS — S76212D Strain of adductor muscle, fascia and tendon of left thigh, subsequent encounter: Secondary | ICD-10-CM | POA: Diagnosis not present

## 2016-06-21 DIAGNOSIS — M79605 Pain in left leg: Secondary | ICD-10-CM | POA: Diagnosis not present

## 2016-06-23 DIAGNOSIS — R262 Difficulty in walking, not elsewhere classified: Secondary | ICD-10-CM | POA: Diagnosis not present

## 2016-06-23 DIAGNOSIS — M79605 Pain in left leg: Secondary | ICD-10-CM | POA: Diagnosis not present

## 2016-06-23 DIAGNOSIS — S76212D Strain of adductor muscle, fascia and tendon of left thigh, subsequent encounter: Secondary | ICD-10-CM | POA: Diagnosis not present

## 2016-06-28 DIAGNOSIS — M79605 Pain in left leg: Secondary | ICD-10-CM | POA: Diagnosis not present

## 2016-06-28 DIAGNOSIS — R262 Difficulty in walking, not elsewhere classified: Secondary | ICD-10-CM | POA: Diagnosis not present

## 2016-06-28 DIAGNOSIS — S76212D Strain of adductor muscle, fascia and tendon of left thigh, subsequent encounter: Secondary | ICD-10-CM | POA: Diagnosis not present

## 2016-06-30 DIAGNOSIS — R262 Difficulty in walking, not elsewhere classified: Secondary | ICD-10-CM | POA: Diagnosis not present

## 2016-06-30 DIAGNOSIS — M79605 Pain in left leg: Secondary | ICD-10-CM | POA: Diagnosis not present

## 2016-06-30 DIAGNOSIS — S76212D Strain of adductor muscle, fascia and tendon of left thigh, subsequent encounter: Secondary | ICD-10-CM | POA: Diagnosis not present

## 2016-07-05 DIAGNOSIS — S76212D Strain of adductor muscle, fascia and tendon of left thigh, subsequent encounter: Secondary | ICD-10-CM | POA: Diagnosis not present

## 2016-07-05 DIAGNOSIS — R262 Difficulty in walking, not elsewhere classified: Secondary | ICD-10-CM | POA: Diagnosis not present

## 2016-07-05 DIAGNOSIS — M79605 Pain in left leg: Secondary | ICD-10-CM | POA: Diagnosis not present

## 2016-07-07 DIAGNOSIS — R262 Difficulty in walking, not elsewhere classified: Secondary | ICD-10-CM | POA: Diagnosis not present

## 2016-07-07 DIAGNOSIS — M79605 Pain in left leg: Secondary | ICD-10-CM | POA: Diagnosis not present

## 2016-07-07 DIAGNOSIS — S76212D Strain of adductor muscle, fascia and tendon of left thigh, subsequent encounter: Secondary | ICD-10-CM | POA: Diagnosis not present

## 2016-07-12 DIAGNOSIS — M79605 Pain in left leg: Secondary | ICD-10-CM | POA: Diagnosis not present

## 2016-07-12 DIAGNOSIS — R262 Difficulty in walking, not elsewhere classified: Secondary | ICD-10-CM | POA: Diagnosis not present

## 2016-07-12 DIAGNOSIS — S76212D Strain of adductor muscle, fascia and tendon of left thigh, subsequent encounter: Secondary | ICD-10-CM | POA: Diagnosis not present

## 2016-07-25 ENCOUNTER — Ambulatory Visit (INDEPENDENT_AMBULATORY_CARE_PROVIDER_SITE_OTHER): Payer: BLUE CROSS/BLUE SHIELD | Admitting: Physician Assistant

## 2016-07-25 VITALS — BP 122/72 | HR 78 | Temp 98.2°F | Resp 17 | Ht 68.0 in | Wt 159.0 lb

## 2016-07-25 DIAGNOSIS — J3489 Other specified disorders of nose and nasal sinuses: Secondary | ICD-10-CM

## 2016-07-25 DIAGNOSIS — R070 Pain in throat: Secondary | ICD-10-CM

## 2016-07-25 LAB — POCT RAPID STREP A (OFFICE): RAPID STREP A SCREEN: NEGATIVE

## 2016-07-25 MED ORDER — GUAIFENESIN ER 1200 MG PO TB12: 1.0000 | ORAL_TABLET | Freq: Two times a day (BID) | ORAL | 1 refills | Status: DC | PRN

## 2016-07-25 MED ORDER — IPRATROPIUM BROMIDE 0.03 % NA SOLN: 2.0000 | Freq: Two times a day (BID) | NASAL | 0 refills | Status: DC

## 2016-07-25 NOTE — Patient Instructions (Addendum)
Please take the tylenol or ibuprofen for pain.  And the prescriptions as I prescribed.  Let me know if your symptoms do not improve in 5-6 days.   Make sure you are hydrating well with 64 oz of water if not more.  Upper Respiratory Infection, Adult Most upper respiratory infections (URIs) are a viral infection of the air passages leading to the lungs. A URI affects the nose, throat, and upper air passages. The most common type of URI is nasopharyngitis and is typically referred to as "the common cold." URIs run their course and usually go away on their own. Most of the time, a URI does not require medical attention, but sometimes a bacterial infection in the upper airways can follow a viral infection. This is called a secondary infection. Sinus and middle ear infections are common types of secondary upper respiratory infections. Bacterial pneumonia can also complicate a URI. A URI can worsen asthma and chronic obstructive pulmonary disease (COPD). Sometimes, these complications can require emergency medical care and may be life threatening.  CAUSES Almost all URIs are caused by viruses. A virus is a type of germ and can spread from one person to another.  RISKS FACTORS You may be at risk for a URI if:   You smoke.   You have chronic heart or lung disease.  You have a weakened defense (immune) system.   You are very young or very old.   You have nasal allergies or asthma.  You work in crowded or poorly ventilated areas.  You work in health care facilities or schools. SIGNS AND SYMPTOMS  Symptoms typically develop 2-3 days after you come in contact with a cold virus. Most viral URIs last 7-10 days. However, viral URIs from the influenza virus (flu virus) can last 14-18 days and are typically more severe. Symptoms may include:   Runny or stuffy (congested) nose.   Sneezing.   Cough.   Sore throat.   Headache.   Fatigue.   Fever.   Loss of appetite.   Pain in your  forehead, behind your eyes, and over your cheekbones (sinus pain).  Muscle aches.  DIAGNOSIS  Your health care provider may diagnose a URI by:  Physical exam.  Tests to check that your symptoms are not due to another condition such as:  Strep throat.  Sinusitis.  Pneumonia.  Asthma. TREATMENT  A URI goes away on its own with time. It cannot be cured with medicines, but medicines may be prescribed or recommended to relieve symptoms. Medicines may help:  Reduce your fever.  Reduce your cough.  Relieve nasal congestion. HOME CARE INSTRUCTIONS   Take medicines only as directed by your health care provider.   Gargle warm saltwater or take cough drops to comfort your throat as directed by your health care provider.  Use a warm mist humidifier or inhale steam from a shower to increase air moisture. This may make it easier to breathe.  Drink enough fluid to keep your urine clear or pale yellow.   Eat soups and other clear broths and maintain good nutrition.   Rest as needed.   Return to work when your temperature has returned to normal or as your health care provider advises. You may need to stay home longer to avoid infecting others. You can also use a face mask and careful hand washing to prevent spread of the virus.  Increase the usage of your inhaler if you have asthma.   Do not use any tobacco products,  including cigarettes, chewing tobacco, or electronic cigarettes. If you need help quitting, ask your health care provider. PREVENTION  The best way to protect yourself from getting a cold is to practice good hygiene.   Avoid oral or hand contact with people with cold symptoms.   Wash your hands often if contact occurs.  There is no clear evidence that vitamin C, vitamin E, echinacea, or exercise reduces the chance of developing a cold. However, it is always recommended to get plenty of rest, exercise, and practice good nutrition.  SEEK MEDICAL CARE IF:   You  are getting worse rather than better.   Your symptoms are not controlled by medicine.   You have chills.  You have worsening shortness of breath.  You have brown or red mucus.  You have yellow or brown nasal discharge.  You have pain in your face, especially when you bend forward.  You have a fever.  You have swollen neck glands.  You have pain while swallowing.  You have white areas in the back of your throat. SEEK IMMEDIATE MEDICAL CARE IF:   You have severe or persistent:  Headache.  Ear pain.  Sinus pain.  Chest pain.  You have chronic lung disease and any of the following:  Wheezing.  Prolonged cough.  Coughing up blood.  A change in your usual mucus.  You have a stiff neck.  You have changes in your:  Vision.  Hearing.  Thinking.  Mood. MAKE SURE YOU:   Understand these instructions.  Will watch your condition.  Will get help right away if you are not doing well or get worse.   This information is not intended to replace advice given to you by your health care provider. Make sure you discuss any questions you have with your health care provider.   Document Released: 04/26/2001 Document Revised: 03/17/2015 Document Reviewed: 02/05/2014 Elsevier Interactive Patient Education 2016 [employer].  IF you received an x-ray today, you will receive an invoice from [facility]. Please contact [facility] at [phone] with questions or concerns regarding your invoice.   IF you received labwork today, you will receive an invoice from [employer]. Please contact [facility] at [phone] with questions or concerns regarding your invoice.   Our billing staff will not be able to assist you with questions regarding bills from these companies.  You will be contacted with the lab results as soon as they are available. The fastest way to get your results is to activate your My Chart account. Instructions are  located on the last page of this paperwork. If you have not heard from [place] regarding the results in 2 weeks, please contact this office.

## 2016-07-25 NOTE — Progress Notes (Signed)
[facility] [address], [place] [address] [phone]  By signing my name below, I, Moises Blood, attest that this documentation has been prepared under the direction and in the presence of Stephanie English, PA-C. Electronically Signed: Moises Blood, Scribe. 07/25/2016 , 1:45 PM .  Patient was seen in Room 6 .  Date:  07/25/2016   Name:  Matthew Morris   DOB:  May 05, 1986   MRN:  [id number]  PCP:  Hoyt Koch, MD    History of Present Illness: Chief Complaint  Patient presents with   Dizziness   Sinusitis   Sore Throat    Matthew Morris is a 30 y.o. male patient who presents to [facility] complaining of sore throat with sinus pressure, fatigue, and dizziness that started 2 days ago. Patient states he woke up with a sore throat a few days ago. Yesterday, he woke up with sinus pressure all around his ears, nose and eyes. He has lightheadedness with fast positional changes. He blew his nose this morning and had green mucus. He also mentions watery eyes and having a "glassy eyed" feeling. He notes mild seasonal allergies, but denies taking any allergy medications. He's taken dayquil yesterday and had some throat coat tea today. He denies any known sick contact. He denies fever, cough, shortness of breath, or wheezing.   He works in an [employer] in [employer].  He's a former smoker, quit about 9 months ago.   Patient Active Problem List   Diagnosis Date Noted   Heat cramp 06/26/2015   Tick bite 04/03/2015   TOBACCO ABUSE 01/18/2008   Attention deficit hyperactivity disorder (ADHD) 08/13/2007    Past Medical History:  Diagnosis Date   ADD (attention deficit disorder)    Sebaceous cyst    TOBACCO ABUSE     Past Surgical History:  Procedure Laterality Date   oral surgeries     TONSILLECTOMY      Social History  Substance Use Topics   Smoking status: Former Smoker    Types: Cigarettes    Quit date: 11/14/2015   Smokeless tobacco:  Never Used     Comment: see 03/26/14   Alcohol use Yes     Comment:  socially    Family History  Problem Relation Age of Onset   Hypotension Father    Diabetes Other    Depression Mother    Drug abuse Brother    Alcohol abuse Brother    Alcohol abuse Mother     Allergies  Allergen Reactions   Chantix [Varenicline]     Somber, dark mood Anhedonia   Ritalin [Methylphenidate Hcl]     A norexia    Medication list has been reviewed and updated.  Current Outpatient Prescriptions on File Prior to Visit  Medication Sig Dispense Refill   amphetamine-dextroamphetamine (ADDERALL XR) 20 MG 24 hr capsule Take 1 capsule (20 mg total) by mouth every morning. (Patient not taking: Reported on 07/25/2016) 30 capsule 0   No current facility-administered medications on file prior to visit.     Review of Systems  Constitutional: Positive for malaise/fatigue. Negative for chills and fever.  HENT: Positive for sore throat. Negative for ear pain and tinnitus.   Eyes: Positive for discharge.  Respiratory: Positive for sputum production. Negative for cough, shortness of breath and wheezing.   Neurological: Positive for dizziness and headaches (sinus pressure).  Endo/Heme/Allergies: Positive for environmental allergies.     Physical Examination: BP 122/72 (BP Location: Right Arm,  Patient Position: Sitting, Cuff Size: Normal)    Pulse 78    Temp 98.2 F (36.8 C) (Oral)    Resp 17    Ht 5\' 8"  (1.727 m)    Wt 159 lb (72.1 kg)    SpO2 100%    BMI 24.18 kg/m  Ideal Body Weight: @FLOWAMB [id number]  Physical Exam  Constitutional: He is oriented to person, place, and time. He appears well-developed and well-nourished. No distress.  HENT:  Head: Atraumatic.  Right Ear: Tympanic membrane, external ear and ear canal normal.  Left Ear: Tympanic membrane, external ear and ear canal normal.  Nose: Mucosal edema and rhinorrhea present. Right sinus exhibits frontal sinus tenderness. Right  sinus exhibits no maxillary sinus tenderness. Left sinus exhibits frontal sinus tenderness. Left sinus exhibits no maxillary sinus tenderness.  Mouth/Throat: No uvula swelling. No oropharyngeal exudate, posterior oropharyngeal edema or posterior oropharyngeal erythema.  Eyes: Conjunctivae, EOM and lids are normal. Pupils are equal, round, and reactive to light. Right eye exhibits normal extraocular motion. Left eye exhibits normal extraocular motion.  Neck: Trachea normal and full passive range of motion without pain. No edema and no erythema present.  Cardiovascular: Normal rate.   Pulmonary/Chest: Effort normal and breath sounds normal. No respiratory distress. He has no decreased breath sounds. He has no wheezes. He has no rhonchi.  Neurological: He is alert and oriented to person, place, and time.  Skin: Skin is warm and dry. He is not diaphoretic.  Psychiatric: He has a normal mood and affect. His behavior is normal.   Results for orders placed or performed in visit on 07/25/16  POCT rapid strep A  Result Value Ref Range   Rapid Strep A Screen Negative Negative     Assessment and Plan: Matthew Morris is a 30 y.o. male who is here today for throat pain. Throat pain - Plan: POCT rapid strep A  Sinus pressure - Plan: Guaifenesin (MUCINEX MAXIMUM STRENGTH) 1200 MG TB12, ipratropium (ATROVENT) 0.03 % nasal spray  If symptoms do not improve after 5 days, please do cefdinir 300mg  bid for 10 days. 20 tablets 0 refills.  Ivar Drape, PA-C [facility] 07/25/2016 1:35 PM

## 2016-07-25 NOTE — Progress Notes (Signed)
u

## 2016-07-30 ENCOUNTER — Ambulatory Visit (INDEPENDENT_AMBULATORY_CARE_PROVIDER_SITE_OTHER): Payer: BLUE CROSS/BLUE SHIELD | Admitting: Urgent Care

## 2016-07-30 VITALS — BP 122/72 | HR 81 | Temp 98.3°F | Resp 17 | Ht 68.5 in | Wt 157.0 lb

## 2016-07-30 DIAGNOSIS — J019 Acute sinusitis, unspecified: Secondary | ICD-10-CM

## 2016-07-30 DIAGNOSIS — H938X2 Other specified disorders of left ear: Secondary | ICD-10-CM | POA: Diagnosis not present

## 2016-07-30 DIAGNOSIS — J3489 Other specified disorders of nose and nasal sinuses: Secondary | ICD-10-CM | POA: Diagnosis not present

## 2016-07-30 MED ORDER — AMOXICILLIN 875 MG PO TABS: 875.0000 mg | ORAL_TABLET | Freq: Two times a day (BID) | ORAL | 0 refills | Status: DC

## 2016-07-30 NOTE — Patient Instructions (Addendum)
Sinusitis, Adult Sinusitis is redness, soreness, and inflammation of the paranasal sinuses. Paranasal sinuses are air pockets within the bones of your face. They are located beneath your eyes, in the middle of your forehead, and above your eyes. In healthy paranasal sinuses, mucus is able to drain out, and air is able to circulate through them by way of your nose. However, when your paranasal sinuses are inflamed, mucus and air can become trapped. This can allow bacteria and other germs to grow and cause infection. Sinusitis can develop quickly and last only a short time (acute) or continue over a long period (chronic). Sinusitis that lasts for more than 12 weeks is considered chronic. CAUSES Causes of sinusitis include:  Allergies.  Structural abnormalities, such as displacement of the cartilage that separates your nostrils (deviated septum), which can decrease the air flow through your nose and sinuses and affect sinus drainage.  Functional abnormalities, such as when the small hairs (cilia) that line your sinuses and help remove mucus do not work properly or are not present. SIGNS AND SYMPTOMS Symptoms of acute and chronic sinusitis are the same. The primary symptoms are pain and pressure around the affected sinuses. Other symptoms include:  Upper toothache.  Earache.  Headache.  Bad breath.  Decreased sense of smell and taste.  A cough, which worsens when you are lying flat.  Fatigue.  Fever.  Thick drainage from your nose, which often is green and may contain pus (purulent).  Swelling and warmth over the affected sinuses. DIAGNOSIS Your health care provider will perform a physical exam. During your exam, your health care provider may perform any of the following to help determine if you have acute sinusitis or chronic sinusitis:  Look in your nose for signs of abnormal growths in your nostrils (nasal polyps).  Tap over the affected sinus to check for signs of  infection.  View the inside of your sinuses using an imaging device that has a light attached (endoscope). If your health care provider suspects that you have chronic sinusitis, one or more of the following tests may be recommended:  Allergy tests.  Nasal culture. A sample of mucus is taken from your nose, sent to a lab, and screened for bacteria.  Nasal cytology. A sample of mucus is taken from your nose and examined by your health care provider to determine if your sinusitis is related to an allergy. TREATMENT Most cases of acute sinusitis are related to a viral infection and will resolve on their own within 10 days. Sometimes, medicines are prescribed to help relieve symptoms of both acute and chronic sinusitis. These may include pain medicines, decongestants, nasal steroid sprays, or saline sprays. However, for sinusitis related to a bacterial infection, your health care provider will prescribe antibiotic medicines. These are medicines that will help kill the bacteria causing the infection. Rarely, sinusitis is caused by a fungal infection. In these cases, your health care provider will prescribe antifungal medicine. For some cases of chronic sinusitis, surgery is needed. Generally, these are cases in which sinusitis recurs more than 3 times per year, despite other treatments. HOME CARE INSTRUCTIONS  Drink plenty of water. Water helps thin the mucus so your sinuses can drain more easily.  Use a humidifier.  Inhale steam 3-4 times a day (for example, sit in the bathroom with the shower running).  Apply a warm, moist washcloth to your face 3-4 times a day, or as directed by your health care provider.  Use saline nasal sprays to help   moisten and clean your sinuses.  Take medicines only as directed by your health care provider.  If you were prescribed either an antibiotic or antifungal medicine, finish it all even if you start to feel better. SEEK IMMEDIATE MEDICAL CARE IF:  You have  increasing pain or severe headaches.  You have nausea, vomiting, or drowsiness.  You have swelling around your face.  You have vision problems.  You have a stiff neck.  You have difficulty breathing.   This information is not intended to replace advice given to you by your health care provider. Make sure you discuss any questions you have with your health care provider.   Document Released: 10/31/2005 Document Revised: 11/21/2014 Document Reviewed: 11/15/2011 Elsevier Interactive Patient Education 2016 [employer]vier Inc.    IF you received an x-ray today, you will receive an invoice from [facility]. Please contact [facility] at [phone] with questions or concerns regarding your invoice.   IF you received labwork today, you will receive an invoice from [facility]. Please contact [facility] at [phone] with questions or concerns regarding your invoice.   Our billing staff will not be able to assist you with questions regarding bills from these companies.  You will be contacted with the lab results as soon as they are available. The fastest way to get your results is to activate your My Chart account. Instructions are located on the last page of this paperwork. If you have not heard from us regarding the results in 2 weeks, please contact this office.      

## 2016-07-30 NOTE — Progress Notes (Signed)
    MRN: [id number] DOB: [birth year]  Subjective:   Matthew Morris is a 30 y.o. male presenting for chief complaint of Follow-up (sinusitis ) and Cerumen Impaction  Last visit was 07/25/2016 at our clinic. He has been resting, drinking tea and water, supportive care with Mucinex, Atrovent nasal spray. Has been using warm showers. Patient has left sided sinus pain still, has left ear pressure. Sore throat has improved. Denies fever, cough. Would like to have his ears checked for ear wax. Admits history of seasonal allergies. Denies history of asthma. Denies smoking cigarettes.   Matthew Morris has a current medication list which includes the following prescription(s): guaifenesin, ipratropium, and amphetamine-dextroamphetamine. Also is allergic to chantix [varenicline] and ritalin [methylphenidate hcl].  Matthew Morris  has a past medical history of ADD (attention deficit disorder); Sebaceous cyst; and TOBACCO ABUSE. Also  has a past surgical history that includes Tonsillectomy and oral surgeries.  Objective:   Vitals: BP 122/72 (BP Location: Right Arm, Patient Position: Sitting, Cuff Size: Normal)   Pulse 81   Temp 98.3 F (36.8 C) (Oral)   Resp 17   Ht 5' 8.5" (1.74 m)   Wt 157 lb (71.2 kg)   SpO2 99%   BMI 23.52 kg/m   Physical Exam  Constitutional: He is oriented to person, place, and time. He appears well-developed and well-nourished.  HENT:  TM's intact bilaterally but no effusions or erythema. Nasal turbinates erythematous L>R with left maxillary sinus tenderness. Mild postnasal drip present but without oropharyngeal exudates, erythema or abscesses.  Eyes: Right eye exhibits no discharge. Left eye exhibits no discharge. No scleral icterus.  Neck: Normal range of motion. Neck supple.  Cardiovascular: Normal rate.   Pulmonary/Chest: Effort normal.  Lymphadenopathy:    He has no cervical adenopathy.  Neurological: He is alert and oriented to person, place, and time.   Assessment and Plan  :   1. Acute sinusitis, recurrence not specified, unspecified location 2. Sinus pain 3. Ear pressure, left - Will cover for bacterial sinusitis given one-sided maxillary sinus pain despite supportive care. Use amoxicillin for 7 days, continue supportive care. RTC in 1 week if no improvement.  Jaynee Eagles, PA-C [facility] [phone] 07/30/2016 9:07 AM

## 2016-12-05 ENCOUNTER — Telehealth: Payer: Self-pay | Admitting: Internal Medicine

## 2016-12-05 NOTE — Telephone Encounter (Signed)
[facility] - Client [place] Patient Name: CHON STANDARD DOB: 1986-08-27 Initial Comment Caller says, sharp stabbing chest pains for a week and a half, lasts a couple of min each time, when he takes a deep breath. He wants an appt for a physical as well. Nurse Assessment Nurse: Markus Daft, RN, Sherre Poot Date/Time (Eastern Time): 12/05/2016 10:45:48 AM Confirm and document reason for call. If symptomatic, describe symptoms. ---Caller states that he has had sharp stabbing chest pains for a week and a half for a couple of times. It lasts a couple of min each time, when he takes a deep breath. Had it right before he called, but no pain now. He wants an appt for a physical as well. Does the patient have any new or worsening symptoms? ---Yes Will a triage be completed? ---Yes Related visit to physician within the last 2 weeks? ---No Does the PT have any chronic conditions? (i.e. diabetes, asthma, etc.) ---No Is this a behavioral health or substance abuse call? ---No Guidelines Guideline Title Affirmed Question Affirmed Notes Chest Pain [1] Chest pain lasting <= 5 minutes AND [2] NO chest pain or cardiac symptoms now (Exceptions: pains lasting a few seconds) Final Disposition User See Physician within [place], RN, [place] Comments Appt made for tomorrow with Dr. Sharlet Salina at 11:15 am Referrals REFERRED TO PCP OFFICE Disagree/Comply: Comply

## 2016-12-06 ENCOUNTER — Encounter: Payer: Self-pay | Admitting: Internal Medicine

## 2016-12-06 ENCOUNTER — Ambulatory Visit (INDEPENDENT_AMBULATORY_CARE_PROVIDER_SITE_OTHER): Payer: BLUE CROSS/BLUE SHIELD | Admitting: Internal Medicine

## 2016-12-06 ENCOUNTER — Other Ambulatory Visit (INDEPENDENT_AMBULATORY_CARE_PROVIDER_SITE_OTHER): Payer: BLUE CROSS/BLUE SHIELD

## 2016-12-06 VITALS — BP 140/84 | HR 63 | Temp 98.2°F | Resp 14 | Ht 68.5 in | Wt 164.0 lb

## 2016-12-06 DIAGNOSIS — R079 Chest pain, unspecified: Secondary | ICD-10-CM

## 2016-12-06 DIAGNOSIS — Z23 Encounter for immunization: Secondary | ICD-10-CM

## 2016-12-06 LAB — LIPID PANEL
CHOLESTEROL: 223 mg/dL — AB (ref 0–200)
HDL: 47 mg/dL (ref >39.00)
LDL CALC: 152 mg/dL — AB (ref 0–99)
NonHDL: 175.78
Total CHOL/HDL Ratio: 5
Triglycerides: 120 mg/dL (ref 0.0–149.0)
VLDL: 24 mg/dL (ref 0.0–40.0)

## 2016-12-06 LAB — CBC
HEMATOCRIT: 41.6 % (ref 39.0–52.0)
Hemoglobin: 14.4 g/dL (ref 13.0–17.0)
MCHC: 34.7 g/dL (ref 30.0–36.0)
MCV: 87.2 fl (ref 78.0–100.0)
Platelets: 301 10*3/uL (ref 150.0–400.0)
RBC: 4.77 Mil/uL (ref 4.22–5.81)
RDW: 13.2 % (ref 11.5–15.5)
WBC: 9.5 10*3/uL (ref 4.0–10.5)

## 2016-12-06 LAB — COMPREHENSIVE METABOLIC PANEL
ALBUMIN: 4.8 g/dL (ref 3.5–5.2)
ALT: 18 U/L (ref 0–53)
AST: 21 U/L (ref 0–37)
Alkaline Phosphatase: 70 U/L (ref 39–117)
BILIRUBIN TOTAL: 0.5 mg/dL (ref 0.2–1.2)
BUN: 11 mg/dL (ref 6–23)
CHLORIDE: 104 meq/L (ref 96–112)
CO2: 28 mEq/L (ref 19–32)
CREATININE: 0.99 mg/dL (ref 0.40–1.50)
Calcium: 10 mg/dL (ref 8.4–10.5)
GFR: 93.53 mL/min (ref >60.00)
Glucose, Bld: 80 mg/dL (ref 70–99)
Potassium: 4.5 mEq/L (ref 3.5–5.1)
SODIUM: 139 meq/L (ref 135–145)
Total Protein: 7.8 g/dL (ref 6.0–8.3)

## 2016-12-06 LAB — MAGNESIUM: Magnesium: 2.1 mg/dL (ref 1.5–2.5)

## 2016-12-06 LAB — D-DIMER, QUANTITATIVE: D-Dimer, Quant: <0.19 mcg/mL FEU (ref <0.50)

## 2016-12-06 LAB — VITAMIN B12: VITAMIN B 12: 411 pg/mL (ref 211–911)

## 2016-12-06 NOTE — Progress Notes (Signed)
   Subjective:    Patient ID: Matthew Morris, male    DOB: 1986/11/13, 31 y.o.   MRN: [id number]  HPI The patient is a 31 YO man coming in for right sided chest pain. Lasts less than 1 minute. He has had 3 episodes. The first happened while he was playing soccer. He has had 2-3 since that time that happened at rest. He is not taking any medication at this time and weaned off adderall back in December. He did have a groin muscle pull and significant recovery time of 4 months without any activity. Then got a flu over christmas and then sinus infection and did nothing for about 2 weeks. He denies pain at rest. When it comes it is worse with breathing. Between episodes he feels normal. No pain right now. Does not move anywhere. No pain on the left side of chest. No swelling in his legs during anytime in the last 3 months. Stopped smoking about 1 year ago and congratulated him on continued success of quitting.   Review of Systems  Constitutional: Positive for activity change. Negative for appetite change, chills, fatigue, fever and unexpected weight change.  Respiratory: Negative.   Cardiovascular: Positive for chest pain. Negative for palpitations and leg swelling.  Gastrointestinal: Negative.   Musculoskeletal: Positive for myalgias. Negative for arthralgias, back pain and gait problem.  Neurological: Negative.       Objective:   Physical Exam  Constitutional: He is oriented to person, place, and time. He appears well-developed and well-nourished.  HENT:  Head: Normocephalic and atraumatic.  Eyes: EOM are normal.  Cardiovascular: Normal rate and regular rhythm.   No murmur heard. Pulmonary/Chest: Effort normal and breath sounds normal. No respiratory distress. He has no wheezes. He has no rales. He exhibits no tenderness.  No pain with deep breathing.   Abdominal: Soft.  Musculoskeletal: He exhibits no edema or tenderness.  No pain in the right chest, prior area of pain lateral 7th rib area.    Neurological: He is alert and oriented to person, place, and time. Coordination normal.  Skin: Skin is warm and dry.   Vitals:   12/06/16 1121  BP: 140/84  Pulse: 63  Resp: 14  Temp: 98.2 F (36.8 C)  TempSrc: Oral  SpO2: 99%  Weight: 164 lb (74.4 kg)  Height: 5' 8.5" (1.74 m)      Assessment & Plan:  Flu shot given at visit.

## 2016-12-06 NOTE — Progress Notes (Signed)
Pre visit review using our clinic review tool, if applicable. No additional management support is needed unless otherwise documented below in the visit note. 

## 2016-12-06 NOTE — Patient Instructions (Signed)
We are checking the labs today and will send the results on mychart.  Continue to watch the symptoms and let us know if they continue.

## 2016-12-06 NOTE — Assessment & Plan Note (Signed)
Suspect muscular injury from activity with prior underuse. Checking labs today for any abnormalities including CBC, CMP, magnesium, d-dimer (with months of inactivity). Can use heat for the pain. Since it lasts so short he does not need meds for pain. Advised to hydrate well.

## 2016-12-12 ENCOUNTER — Telehealth: Payer: Self-pay | Admitting: Internal Medicine

## 2016-12-12 NOTE — Telephone Encounter (Signed)
[facility] - Client [place] [facility] Patient Name: Matthew Morris DOB: 1986-04-07 Initial Comment Caller states had blood drawn last week. His forearm is painful and still bruised Nurse Assessment Nurse: Markus Daft, RN, Sherre Poot Date/Time (Eastern Time): 12/12/2016 1:28:59 PM Confirm and document reason for call. If symptomatic, describe symptoms. ---Caller states had blood drawn last week at the office lab downstairs. His left forearm is painful and still bruised. Rates 0/10 at rest now, but with stretching his arm straight out then causes shooting pain at 8-9/10 depending how much he tries to move it. Doing his ADL's he notices it. No redness. Does the patient have any new or worsening symptoms? ---Yes Will a triage be completed? ---Yes Related visit to physician within the last 2 weeks? ---Yes Does the PT have any chronic conditions? (i.e. diabetes, asthma, etc.) ---No Is this a behavioral health or substance abuse call? ---No Guidelines Guideline Title Affirmed Question Affirmed Notes IV Site (Skin) Symptoms [1] Pain at IV site or shooting up arm AND [2] NO redness or swelling AND [3] IV running normally Final Disposition User Call PCP within [place], RN, [place] Comments Msg sent to MD for MD response. Do you want him seen? Disagree/Comply: Comply

## 2016-12-15 ENCOUNTER — Ambulatory Visit (INDEPENDENT_AMBULATORY_CARE_PROVIDER_SITE_OTHER): Payer: BLUE CROSS/BLUE SHIELD | Admitting: Internal Medicine

## 2016-12-15 DIAGNOSIS — M7021 Olecranon bursitis, right elbow: Secondary | ICD-10-CM | POA: Insufficient documentation

## 2016-12-15 DIAGNOSIS — M79602 Pain in left arm: Secondary | ICD-10-CM

## 2016-12-15 NOTE — Progress Notes (Signed)
   Subjective:    Patient ID: Matthew Morris, male    DOB: 1986-05-05, 31 y.o.   MRN: [id number]  HPI The patient is a 31 YO man coming in for arm pain left arm. He got blood drawn about 1 week ago and no problems during the draw. The next day he started having some bruising. He is getting some pain with extending his wrist which feels like it is shooting from the elbow to the wrist. This has limited his activity slightly. He never had swelling at the elbow or in the arm. No numbness or weakness. No pain when moving his fingers. He has been using some heat in the last day or two which has helped. The bruise has cleared in the last 2 days or so.   Review of Systems  Constitutional: Positive for activity change. Negative for appetite change, fatigue, fever and unexpected weight change.  Respiratory: Negative.   Cardiovascular: Negative.   Gastrointestinal: Negative.   Musculoskeletal: Positive for myalgias. Negative for arthralgias, back pain, gait problem, joint swelling, neck pain and neck stiffness.  Skin: Positive for color change.  Neurological: Negative.       Objective:   Physical Exam  Constitutional: He is oriented to person, place, and time. He appears well-developed and well-nourished.  HENT:  Head: Normocephalic and atraumatic.  Eyes: EOM are normal.  Neck: Normal range of motion.  Cardiovascular: Normal rate and regular rhythm.   Pulmonary/Chest: Effort normal.  Abdominal: Soft.  Musculoskeletal: He exhibits no tenderness.  No tenderness to palpation over the blood draw site. Some mild tenderness with extension of the wrist.   Neurological: He is alert and oriented to person, place, and time.  Skin: Skin is warm and dry.   Vitals:   12/15/16 0833  BP: 110/80  Pulse: 66  Resp: 12  Temp: 98 F (36.7 C)  TempSrc: Oral  SpO2: 99%  Weight: 169 lb (76.7 kg)  Height: 5' 8.5" (1.74 m)      Assessment & Plan:

## 2016-12-15 NOTE — Assessment & Plan Note (Signed)
Suspect mild bruising from blood draw causing pressure on the nerve with extension. No indication for imaging or medication at this time. He will continue using heat on the area and gradually stretching the area out.

## 2016-12-15 NOTE — Progress Notes (Signed)
Pre visit review using our clinic review tool, if applicable. No additional management support is needed unless otherwise documented below in the visit note. 

## 2017-04-17 ENCOUNTER — Encounter: Payer: Self-pay | Admitting: Nurse Practitioner

## 2017-04-17 ENCOUNTER — Ambulatory Visit (INDEPENDENT_AMBULATORY_CARE_PROVIDER_SITE_OTHER)
Admission: RE | Admit: 2017-04-17 | Discharge: 2017-04-17 | Disposition: A | Discharge status: Home / Self Care | Payer: BLUE CROSS/BLUE SHIELD | Source: Ambulatory Visit | Attending: Nurse Practitioner | Admitting: Nurse Practitioner

## 2017-04-17 ENCOUNTER — Ambulatory Visit (INDEPENDENT_AMBULATORY_CARE_PROVIDER_SITE_OTHER): Payer: BLUE CROSS/BLUE SHIELD | Admitting: Nurse Practitioner

## 2017-04-17 VITALS — BP 126/74 | HR 54 | Temp 98.4°F | Ht 68.5 in | Wt 161.0 lb

## 2017-04-17 DIAGNOSIS — S6991XA Unspecified injury of right wrist, hand and finger(s), initial encounter: Secondary | ICD-10-CM

## 2017-04-17 DIAGNOSIS — M25441 Effusion, right hand: Secondary | ICD-10-CM | POA: Diagnosis not present

## 2017-04-17 DIAGNOSIS — S62602A Fracture of unspecified phalanx of right middle finger, initial encounter for closed fracture: Secondary | ICD-10-CM | POA: Diagnosis not present

## 2017-04-17 DIAGNOSIS — M79644 Pain in right finger(s): Secondary | ICD-10-CM

## 2017-04-17 DIAGNOSIS — S62619A Displaced fracture of proximal phalanx of unspecified finger, initial encounter for closed fracture: Secondary | ICD-10-CM

## 2017-04-17 NOTE — Progress Notes (Signed)
Subjective:  Patient ID: Matthew Morris, male    DOB: December 08, 1985  Age: 31 y.o. MRN: [id number]  CC: Hand Pain (right ring finger pain (cant really move distal knuckle) yesterday. )   Hand Pain   The incident occurred 12 to 24 hours ago. [place] The injury mechanism was a direct blow. The pain is present in the right fingers (right ring finger ). The quality of the pain is described as aching. The pain does not radiate. The pain has been constant since the incident. Pertinent negatives include no chest pain, muscle weakness, numbness or tingling. The symptoms are aggravated by movement and palpation. He has tried ice for the symptoms. The treatment provided mild relief.    No outpatient prescriptions prior to visit.   No facility-administered medications prior to visit.     ROS See HPI  Objective:  BP 126/74   Pulse (!) 54   Temp 98.4 F (36.9 C)   Ht 5' 8.5" (1.74 m)   Wt 161 lb (73 kg)   SpO2 98%   BMI 24.12 kg/m   BP Readings from Last 3 Encounters:  04/17/17 126/74  12/15/16 110/80  12/06/16 140/84    Wt Readings from Last 3 Encounters:  04/17/17 161 lb (73 kg)  12/15/16 169 lb (76.7 kg)  12/06/16 164 lb (74.4 kg)    Physical Exam  Constitutional: He is oriented to person, place, and time.  Cardiovascular: Normal rate.   Pulmonary/Chest: Effort normal.  Musculoskeletal: He exhibits edema and tenderness.       Right hand: He exhibits decreased range of motion, tenderness, bony tenderness and swelling. Normal sensation noted. Decreased strength noted.       Hands: Neurological: He is alert and oriented to person, place, and time.  Vitals reviewed.   Lab Results  Component Value Date   WBC 9.5 12/06/2016   HGB 14.4 12/06/2016   HCT 41.6 12/06/2016   PLT 301.0 12/06/2016   GLUCOSE 80 12/06/2016   CHOL 223 (H) 12/06/2016   TRIG 120.0 12/06/2016   HDL 47.00 12/06/2016   LDLDIRECT 136.3 10/09/2012   LDLCALC 152 (H) 12/06/2016   ALT 18 12/06/2016   AST 21 12/06/2016   NA 139 12/06/2016   K 4.5 12/06/2016   CL 104 12/06/2016   CREATININE 0.99 12/06/2016   BUN 11 12/06/2016   CO2 28 12/06/2016    Dg Finger Thumb Right  Result Date: 04/03/2013 *RADIOLOGY REPORT* Clinical Data: Pain.  Injury 6 weeks ago.  Pain at carpal metacarpal joint. RIGHT THUMB 2+V Comparison: Hand films 11/27/2010 Findings: No acute fracture or dislocation. IMPRESSION: No acute osseous abnormality. Original Report Authenticated By: Abigail Miyamoto, M.D.    Assessment & Plan:   Matthew Morris was seen today for hand pain.  Diagnoses and all orders for this visit:  Closed avulsion fracture of proximal phalanx of finger, initial encounter -     DG Finger Ring Right; Future -     Ambulatory referral to Hand Surgery  Finger injury, right, initial encounter -     DG Finger Ring Right; Future -     Ambulatory referral to Hand Surgery  Finger pain, right -     DG Finger Ring Right; Future -     Ambulatory referral to Hand Surgery   Matthew Morris does not currently have medications on file.  No orders of the defined types were placed in this encounter.   Follow-up: Return if symptoms worsen or fail  to improve.  Wilfred Lacy, NP

## 2017-04-17 NOTE — Patient Instructions (Signed)
Patient provided with Toad Finger Splint in office.  Avulsion Fracture of the Hand An avulsion fracture of the hand means that a piece of bone in your hand or wrist has been torn away. Bones are connected to other bones by strong bands of connective tissue (ligaments). Muscles are also connected to bones with connective tissue (tendons). Avulsion fractures occur when severe stress on a bone from a ligament or tendon causes a small piece of bone to be pulled away. What are the causes? Avulsion fractures of the hand may be caused by:  Falling with your hand outstretched.  Sports injuries.  Injuries where a thumb or finger is jammed against a hard surface.  What increases the risk? You may have a higher risk of avulsion fracture of the hand if you:  Participate in activities during which falling is more likely, such as: ? Gymnastics. ? Skiing. ? Ice skating.  Participate in activities during which jamming your thumb or finger is more likely, such as basketball.  Have a job or participate in activities in which sudden force may occur on one or more fingers, such as: ? Football. ? Architect work. ? Carpentry. ? Factory work.  Have had diabetes for many years.  Have osteoporosis.  What are the signs or symptoms? The most common symptom of an avulsion fracture of the hand is intense pain at the time of injury. You may also feel a pop or tearing. The pain continues after the injury. Other signs and symptoms may include:  Swelling.  Bruising.  Pain with movement.  Pain when pressure is applied to the injured area.  Warmth over the injured area.  How is this diagnosed? An avulsion fracture of the the hand may be diagnosed by:  History. Your health care provider will ask you what occurred during the time of your injury and whether you had any pain in the area before your injury.  Physical exam. Your health care provider may try to move your hand, fingers, and wrist to check  for pain and your ability to move these parts of your hand.  X-ray. This will show if any bones are fractured or out of place.  MRI. This will show your tendons and ligaments. Some avulsion fractures are associated with an injury to a tendon or ligament.  How is this treated? Treatment for an avulsion fracture of the hand depends on the size of the bone that is out of place and how much it has been pulled out of place. Small avulsion fractures may be treated with rest and support. Large fragments of bone usually need to be reattached surgically. Treatment of these fractures may also require physical therapy to regain full function of your hand or wrist. Treatment may include:  Rest, ice, compression, and elevation (RICE treatment) as directed by your health care provider.  Medicines that reduce pain and swelling (NSAIDs).  Wearing a splint, elastic wrap, or cast as directed by your health care provider.  Surgery to reattach the bone and tendon or ligament.  Physical therapy. This may last for several months.  Follow these instructions at home:  Take medicines only as directed by your health care provider.  Rest your hand until your health care provider says you can resume activity.  Keep your hand raised above the level of your heart when resting.  Apply ice to the injured area: ? Place ice in a plastic bag. ? Place a towel between your skin and the bag. ? Leave the ice  for 20 minutes, 2-3 times a day.  Do not allow your cast or splint to get wet as directed by your health care provider.  Keep all follow-up visits as directed by your health care provider. This is important. Contact a health care provider if:  Your pain gets worse.  You have chills or fever.  Your cast or splint is damaged.  Your cast has a bad odor or stains from fluids from your wound. Get help right away if:  Your hand is cold, blue, or pale.  You have pain, swelling, redness, or numbness below your  cast or splint. This information is not intended to replace advice given to you by your health care provider. Make sure you discuss any questions you have with your health care provider. Document Released: 12/08/2004 Document Revised: 04/07/2016 Document Reviewed: 01/24/2014 Elsevier Interactive Patient Education  [employer].

## 2017-04-18 DIAGNOSIS — S62604A Fracture of unspecified phalanx of right ring finger, initial encounter for closed fracture: Secondary | ICD-10-CM | POA: Diagnosis not present

## 2017-06-01 ENCOUNTER — Encounter: Payer: Self-pay | Admitting: Nurse Practitioner

## 2017-06-01 ENCOUNTER — Ambulatory Visit (INDEPENDENT_AMBULATORY_CARE_PROVIDER_SITE_OTHER): Payer: BLUE CROSS/BLUE SHIELD | Admitting: Nurse Practitioner

## 2017-06-01 VITALS — BP 106/66 | HR 68 | Temp 98.1°F | Ht 68.5 in | Wt 158.0 lb

## 2017-06-01 DIAGNOSIS — S59901A Unspecified injury of right elbow, initial encounter: Secondary | ICD-10-CM

## 2017-06-01 DIAGNOSIS — M25521 Pain in right elbow: Secondary | ICD-10-CM | POA: Diagnosis not present

## 2017-06-01 DIAGNOSIS — F909 Attention-deficit hyperactivity disorder, unspecified type: Secondary | ICD-10-CM

## 2017-06-01 DIAGNOSIS — M7021 Olecranon bursitis, right elbow: Secondary | ICD-10-CM

## 2017-06-01 MED ORDER — NAPROXEN 500 MG PO TABS: 500.0000 mg | ORAL_TABLET | Freq: Two times a day (BID) | ORAL | 0 refills | Status: DC | PRN

## 2017-06-01 MED ORDER — AMPHETAMINE-DEXTROAMPHET ER 20 MG PO CP24: 20.0000 mg | ORAL_CAPSULE | ORAL | 0 refills | Status: DC

## 2017-06-01 NOTE — Progress Notes (Signed)
Subjective:  Patient ID: Matthew Morris, male    DOB: December 30, 1985  Age: 31 y.o. MRN: [id number]  CC: Follow-up (adderall consult--cant focus leade to trouble at work/ elbow injury 2 wk ago--right side--cant put weight on it,painful)   Fall  The accident occurred more than 1 week ago. The fall occurred while running. He fell from an unknown height. He landed on concrete. The point of impact was the right elbow. The pain is present in the right elbow. The symptoms are aggravated by pressure on injury (palpation). Pertinent negatives include no fever, loss of consciousness, numbness or tingling. Treatments tried: cold compress. The treatment provided mild relief.    ADHD: Chronic. Previous use of adderall, stopped 9-8months ago. Now having Difficulty at work and home in completing chores. Denies any increased anxiety or stress Will like to consider resuming  Adderall. For use Monday-Friday only States he is cutting down tobacco use Social ETOH use (once a week) occasional marijuana use. Drinks about 3-4 cups of coffee.  Indication for adderral use Medication and dose: adderall 20mg  # pills per month: 30 Last UDS date: 03/24/2015 Pain contract signed (Y/N): yes Date narcotic database last reviewed (include red flags): no red flags No controlled substance filled in last 1year.  No outpatient prescriptions prior to visit.   No facility-administered medications prior to visit.     ROS Review of Systems  Constitutional: Negative for fever.  Musculoskeletal: Positive for falls and joint pain.  Skin: Negative.   Neurological: Negative for dizziness, tingling, sensory change, focal weakness, loss of consciousness and numbness.  Psychiatric/Behavioral: Negative for depression, hallucinations, memory loss, substance abuse and suicidal ideas. The patient is not nervous/anxious and does not have insomnia.     Objective:  BP 106/66   Pulse 68   Temp 98.1 F (36.7 C)   Ht 5' 8.5"  (1.74 m)   Wt 158 lb (71.7 kg)   SpO2 99%   BMI 23.67 kg/m   BP Readings from Last 3 Encounters:  06/01/17 106/66  04/17/17 126/74  12/15/16 110/80    Wt Readings from Last 3 Encounters:  06/01/17 158 lb (71.7 kg)  04/17/17 161 lb (73 kg)  12/15/16 169 lb (76.7 kg)    Physical Exam  Constitutional: He is oriented to person, place, and time. No distress.  Cardiovascular: Normal rate.   Pulmonary/Chest: Effort normal.  Musculoskeletal: Normal range of motion. He exhibits tenderness. He exhibits no deformity.       Right shoulder: Normal.       Right elbow: He exhibits normal range of motion, no swelling, no effusion, no deformity and no laceration. Tenderness found. Olecranon process tenderness noted. No radial head, no medial epicondyle and no lateral epicondyle tenderness noted.       Right wrist: Normal.  Swollen and tender olecranon bursa with mild erythema.  Neurological: He is alert and oriented to person, place, and time.  Skin: Skin is warm and dry.  Psychiatric: He has a normal mood and affect. His behavior is normal. Thought content normal.  Vitals reviewed.   Lab Results  Component Value Date   WBC 9.5 12/06/2016   HGB 14.4 12/06/2016   HCT 41.6 12/06/2016   PLT 301.0 12/06/2016   GLUCOSE 80 12/06/2016   CHOL 223 (H) 12/06/2016   TRIG 120.0 12/06/2016   HDL 47.00 12/06/2016   LDLDIRECT 136.3 10/09/2012   LDLCALC 152 (H) 12/06/2016   ALT 18 12/06/2016   AST 21 12/06/2016   NA 139  12/06/2016   K 4.5 12/06/2016   CL 104 12/06/2016   CREATININE 0.99 12/06/2016   BUN 11 12/06/2016   CO2 28 12/06/2016    Dg Finger Ring Right  Result Date: 04/17/2017 CLINICAL DATA:  Pain of the right finger post injury. EXAM: RIGHT RING FINGER 2+V COMPARISON:  None. FINDINGS: There is an avulsion fracture of the base of the middle right fourth phalanx with extension to the proximal interphalangeal joint. There is an associated soft tissue swelling. No other fractures are  visualized. Osseous mineralization is normal. IMPRESSION: Avulsion fracture of the base of the middle right fourth phalanx. Electronically Signed   By: Fidela Salisbury M.D.   On: 04/17/2017 17:10    Assessment & Plan:   Olegario was seen today for follow-up.  Diagnoses and all orders for this visit:  Injury of right elbow, initial encounter -     naproxen (NAPROSYN) 500 MG tablet; Take 1 tablet (500 mg total) by mouth 2 (two) times daily as needed (for pain, take with food).  Right elbow pain  Attention deficit hyperactivity disorder (ADHD), unspecified ADHD type -     amphetamine-dextroamphetamine (ADDERALL XR) 20 MG 24 hr capsule; Take 1 capsule (20 mg total) by mouth every morning.  Olecranon bursitis of right elbow -     naproxen (NAPROSYN) 500 MG tablet; Take 1 tablet (500 mg total) by mouth 2 (two) times daily as needed (for pain, take with food).   I am having Mr. Ruby start on amphetamine-dextroamphetamine and naproxen.  Meds ordered this encounter  Medications  . amphetamine-dextroamphetamine (ADDERALL XR) 20 MG 24 hr capsule    Sig: Take 1 capsule (20 mg total) by mouth every morning.    Dispense:  30 capsule    Refill:  0    Order Specific Question:   Supervising Provider    Answer:   Cassandria Anger [[id number]]  . naproxen (NAPROSYN) 500 MG tablet    Sig: Take 1 tablet (500 mg total) by mouth 2 (two) times daily as needed (for pain, take with food).    Dispense:  30 tablet    Refill:  0    Order Specific Question:   Supervising Provider    Answer:   Cassandria Anger [[id number]]    Follow-up: Return in about 4 weeks (around 06/29/2017) for ADHD, adderall use?Marland Kitchen  Wilfred Lacy, NP

## 2017-06-01 NOTE — Patient Instructions (Addendum)
Wear elbow sleeve morning and night for 1week, then at night only till swelling resolve.  Apply cold compress 2-3times a day x 10-6mins at a time.  Consider elbow x-ray is no improvement by next OV.  Advised to limit caffeine intake to 2cups a day. Avoid marijuana or elicit substance use.  Elbow Bursitis A bursa is a fluid-filled sac that covers and protects a joint. Bursitis is when the fluid-filled sac gets puffy and sore (inflamed). Elbow bursitis, also called olecranon bursitis, happens over your elbow. This may be caused by:  Injury (acute trauma) to your elbow.  Leaning on hard surfaces for long periods of time.  Infection from an injury that breaks the skin near your elbow.  A bone growth (spur) that forms at the tip of your elbow.  A medical condition that causes inflammation in your body, such as: ? Gout. ? Rheumatoid arthritis.  Sometimes the cause is not known. Follow these instructions at home:  Take medicines only as told by your doctor.  If you were prescribed an antibiotic medicine, finish all of it even if you start to feel better.  If your bursitis is caused by an injury, rest your elbow and wear your bandage as told by your doctor. You may also apply ice to the injured area as told by your doctor: ? Put ice in a plastic bag. ? Place a towel between your skin and the bag. ? Leave the ice on for 20 minutes, 2-3 times per day.  Do not do any activities that cause pain to your elbow.  Use elbow pads or wraps to cushion your elbow. Contact a doctor if:  You have a fever.  Your symptoms do not get better with treatment.  Your pain or swelling gets worse.  Your pain or swelling goes away and then comes back.  You have drainage of pus from the swollen area over your elbow. This information is not intended to replace advice given to you by your health care provider. Make sure you discuss any questions you have with your health care provider. Document  Released: 04/20/2010 Document Revised: 04/07/2016 Document Reviewed: 07/09/2014 Elsevier Interactive Patient Education  [employer].

## 2017-07-24 ENCOUNTER — Ambulatory Visit (INDEPENDENT_AMBULATORY_CARE_PROVIDER_SITE_OTHER): Payer: BLUE CROSS/BLUE SHIELD | Admitting: Internal Medicine

## 2017-07-24 ENCOUNTER — Encounter: Payer: Self-pay | Admitting: Internal Medicine

## 2017-07-24 VITALS — BP 120/68 | HR 68 | Temp 98.2°F | Ht 68.5 in | Wt 157.0 lb

## 2017-07-24 DIAGNOSIS — M7021 Olecranon bursitis, right elbow: Secondary | ICD-10-CM

## 2017-07-24 DIAGNOSIS — Z23 Encounter for immunization: Secondary | ICD-10-CM

## 2017-07-24 NOTE — Patient Instructions (Signed)
We have given you the exercises for the elbow.

## 2017-07-25 NOTE — Assessment & Plan Note (Signed)
Can continue using NSAIDs otc for pain and icing. Given some exercises to help. Advised that swelling is minimal and no intervention is recommended at this time. Avoid trauma to the area. If swelling worsens we can consider drainage.

## 2017-07-25 NOTE — Progress Notes (Signed)
   Subjective:    Patient ID: Matthew Morris, male    DOB: 17-Jun-1986, 31 y.o.   MRN: [id number]  HPI The patient is a 31 YO man coming in for olecranon bursitis. Started back in July from some injury with sports. He was given rx for naproxen and encouraged to ice the area. He is having some improvement but still some swelling. He is worried about permanent injury or bone fracture. Still hurts with resting on the elbow and hitting it against things accidentally. He is trying to be careful. Not taking the naproxen anymore. Rarely icing.   Review of Systems  Constitutional: Negative.   Respiratory: Negative.   Cardiovascular: Negative.   Gastrointestinal: Negative.   Musculoskeletal: Positive for arthralgias and myalgias. Negative for back pain, gait problem, joint swelling, neck pain and neck stiffness.  Skin: Negative.       Objective:   Physical Exam  Constitutional: He is oriented to person, place, and time. He appears well-developed and well-nourished.  HENT:  Head: Normocephalic and atraumatic.  Eyes: EOM are normal.  Cardiovascular: Normal rate and regular rhythm.   Pulmonary/Chest: Effort normal and breath sounds normal. No respiratory distress. He has no wheezes. He has no rales.  Abdominal: Soft. He exhibits no distension. There is no tenderness. There is no rebound.  Musculoskeletal: He exhibits tenderness.  Right olecranon bursa with slight swelling compared to left, no bone fragments appreciated  Neurological: He is alert and oriented to person, place, and time. Coordination normal.  Skin: Skin is warm and dry.   Vitals:   07/24/17 1613  BP: 120/68  Pulse: 68  Temp: 98.2 F (36.8 C)  TempSrc: Oral  SpO2: 99%  Weight: 157 lb (71.2 kg)  Height: 5' 8.5" (1.74 m)      Assessment & Plan:  Flu shot given at visit.

## 2017-12-29 DIAGNOSIS — R51 Headache: Secondary | ICD-10-CM | POA: Diagnosis not present

## 2017-12-29 DIAGNOSIS — S134XXA Sprain of ligaments of cervical spine, initial encounter: Secondary | ICD-10-CM | POA: Diagnosis not present

## 2017-12-29 DIAGNOSIS — M9901 Segmental and somatic dysfunction of cervical region: Secondary | ICD-10-CM | POA: Diagnosis not present

## 2017-12-29 DIAGNOSIS — M9902 Segmental and somatic dysfunction of thoracic region: Secondary | ICD-10-CM | POA: Diagnosis not present

## 2018-04-03 ENCOUNTER — Encounter: Payer: Self-pay | Admitting: Internal Medicine

## 2018-04-03 ENCOUNTER — Ambulatory Visit: Payer: BLUE CROSS/BLUE SHIELD | Admitting: Internal Medicine

## 2018-04-03 DIAGNOSIS — Z7189 Other specified counseling: Secondary | ICD-10-CM | POA: Diagnosis not present

## 2018-04-03 DIAGNOSIS — Z7184 Encounter for health counseling related to travel: Secondary | ICD-10-CM | POA: Insufficient documentation

## 2018-04-03 DIAGNOSIS — Z0289 Encounter for other administrative examinations: Secondary | ICD-10-CM

## 2018-04-03 NOTE — Progress Notes (Signed)
   Subjective:    Patient ID: Matthew Morris, male    DOB: 01-25-1986, 32 y.o.   MRN: [id number]  HPI The patient is a 32 YO man coming in for forms signed that he is fit to go on birthright to [place]. He denies any chest pains or SOB. No new abdominal pain or stomach problems. Denies medication changes.   Review of Systems  Constitutional: Negative.   HENT: Negative.   Eyes: Negative.   Respiratory: Negative for cough, chest tightness and shortness of breath.   Cardiovascular: Negative for chest pain, palpitations and leg swelling.  Gastrointestinal: Negative for abdominal distention, abdominal pain, constipation, diarrhea, nausea and vomiting.  Musculoskeletal: Negative.   Skin: Negative.   Neurological: Negative.   Psychiatric/Behavioral: Negative.       Objective:   Physical Exam  Constitutional: He is oriented to person, place, and time. He appears well-developed and well-nourished.  HENT:  Head: Normocephalic and atraumatic.  Eyes: EOM are normal.  Neck: Normal range of motion.  Cardiovascular: Normal rate and regular rhythm.  Pulmonary/Chest: Effort normal and breath sounds normal. No respiratory distress. He has no wheezes. He has no rales.  Abdominal: Soft. Bowel sounds are normal. He exhibits no distension. There is no tenderness. There is no rebound.  Musculoskeletal: He exhibits no edema.  Neurological: He is alert and oriented to person, place, and time. Coordination normal.  Skin: Skin is warm and dry.   Vitals:   04/03/18 1537  BP: 110/66  Pulse: 68  Temp: 98.1 F (36.7 C)  TempSrc: Oral  SpO2: 98%  Weight: 159 lb (72.1 kg)  Height: 5' 8.5" (1.74 m)      Assessment & Plan:

## 2018-04-03 NOTE — Assessment & Plan Note (Signed)
Medically cleared to travel. Talked to him about safety while traveling and ensure good hydration while outdoor activity.

## 2018-04-13 ENCOUNTER — Ambulatory Visit: Payer: BLUE CROSS/BLUE SHIELD | Admitting: Family

## 2018-04-13 ENCOUNTER — Encounter: Payer: Self-pay | Admitting: Family

## 2018-04-13 VITALS — BP 112/66 | HR 65 | Temp 98.3°F | Ht 68.5 in | Wt 159.1 lb

## 2018-04-13 DIAGNOSIS — L255 Unspecified contact dermatitis due to plants, except food: Secondary | ICD-10-CM

## 2018-04-13 MED ORDER — METHYLPREDNISOLONE 4 MG PO TBPK: ORAL_TABLET | ORAL | 0 refills | Status: DC

## 2018-04-13 MED ORDER — BETAMETHASONE DIPROPIONATE 0.05 % EX CREA: TOPICAL_CREAM | Freq: Two times a day (BID) | CUTANEOUS | 0 refills | Status: AC

## 2018-04-13 NOTE — Progress Notes (Signed)
  Matthew Morris is a 32 y.o. male with the following history as recorded in [employer]:  Patient Active Problem List   Diagnosis Date Noted  . Travel advice encounter 04/03/2018  . Olecranon bursitis of right elbow 12/15/2016  . TOBACCO ABUSE 01/18/2008  . Attention deficit hyperactivity disorder (ADHD) 08/13/2007    Current Outpatient Medications  Medication Sig Dispense Refill  . betamethasone dipropionate (DIPROLENE) 0.05 % cream Apply topically 2 (two) times daily. 30 g 0  . methylPREDNISolone (MEDROL DOSEPAK) 4 MG TBPK tablet Taper as directed 21 tablet 0  . naproxen (NAPROSYN) 500 MG tablet Take 1 tablet (500 mg total) by mouth 2 (two) times daily as needed (for pain, take with food). (Patient not taking: Reported on 04/13/2018) 30 tablet 0   No current facility-administered medications for this visit.     Allergies: Chantix [varenicline] and Ritalin [methylphenidate hcl]  Past Medical History:  Diagnosis Date  . ADD (attention deficit disorder)   . Sebaceous cyst   . TOBACCO ABUSE     Past Surgical History:  Procedure Laterality Date  . oral surgeries    . TONSILLECTOMY      Family History  Problem Relation Age of Onset  . Hypotension Father   . Depression Mother   . Alcohol abuse Mother   . Diabetes Other   . Drug abuse Brother   . Alcohol abuse Brother     Social History   Tobacco Use  . Smoking status: Former Smoker    Types: Cigarettes    Last attempt to quit: 11/14/2015    Years since quitting: 2.4  . Smokeless tobacco: Never Used  . Tobacco comment: see 03/26/14  Substance Use Topics  . Alcohol use: Yes    Comment:  socially    Subjective:  Patient presents with concerns about worsening "poison ivy" on both of his forearms; was walking in woods recently and thinks this is where exposure occurred; has tried using Calamine lotion with no benefit; will be leaving for [place] in 10 days and concerned about not being able to go on his trip.  Objective:   Vitals:   04/13/18 1500  BP: 112/66  Pulse: 65  Temp: 98.3 F (36.8 C)  TempSrc: Oral  SpO2: 99%  Weight: 159 lb 1.9 oz (72.2 kg)  Height: 5' 8.5" (1.74 m)    General: Well developed, well nourished, in no acute distress  Skin : Warm and dry. C/w contact dermatitis on forearms Head: Normocephalic and atraumatic  Lungs: Respirations unlabored; clear to auscultation bilaterally without wheeze, rales, rhonchi  Neurologic: Alert and oriented; speech intact; face symmetrical; moves all extremities well; CNII-XII intact without focal deficit   Assessment:  1. Contact dermatitis due to plants, except food, unspecified contact dermatitis type     Plan:  Rx for Medrol Dose Pak #1 take as directed; Rx for Diprolene Cream apply bid to affected area; follow-up worse, no better.   No follow-ups on file.  No orders of the defined types were placed in this encounter.   Requested Prescriptions   Signed Prescriptions Disp Refills  . methylPREDNISolone (MEDROL DOSEPAK) 4 MG TBPK tablet 21 tablet 0    Sig: Taper as directed  . betamethasone dipropionate (DIPROLENE) 0.05 % cream 30 g 0    Sig: Apply topically 2 (two) times daily.

## 2018-05-07 ENCOUNTER — Ambulatory Visit (INDEPENDENT_AMBULATORY_CARE_PROVIDER_SITE_OTHER)
Admission: RE | Admit: 2018-05-07 | Discharge: 2018-05-07 | Disposition: A | Discharge status: Home / Self Care | Payer: BLUE CROSS/BLUE SHIELD | Source: Ambulatory Visit | Attending: Internal Medicine | Admitting: Internal Medicine

## 2018-05-07 ENCOUNTER — Encounter: Payer: Self-pay | Admitting: Internal Medicine

## 2018-05-07 ENCOUNTER — Ambulatory Visit: Payer: BLUE CROSS/BLUE SHIELD | Admitting: Internal Medicine

## 2018-05-07 VITALS — BP 108/80 | HR 81 | Temp 98.3°F | Ht 68.5 in | Wt 161.0 lb

## 2018-05-07 DIAGNOSIS — M25471 Effusion, right ankle: Secondary | ICD-10-CM

## 2018-05-07 DIAGNOSIS — M7989 Other specified soft tissue disorders: Secondary | ICD-10-CM | POA: Diagnosis not present

## 2018-05-07 NOTE — Patient Instructions (Signed)
RICE for Routine Care of Injuries Many injuries can be cared for using rest, ice, compression, and elevation (RICE therapy). Using RICE therapy can help to lessen pain and swelling. It can help your body to heal. Rest Reduce your normal activities and avoid using the injured part of your body. You can go back to your normal activities when you feel okay and your doctor says it is okay. Ice Do not put ice on your bare skin.  Put ice in a plastic bag.  Place a towel between your skin and the bag.  Leave the ice on for 20 minutes, 2-3 times a day.  Do this for as long as told by your doctor. Compression Compression means putting pressure on the injured area. This can be done with an elastic bandage. If an elastic bandage has been applied:  Remove and reapply the bandage every 3-4 hours or as told by your doctor.  Make sure the bandage is not wrapped too tight. Wrap the bandage more loosely if part of your body beyond the bandage is blue, swollen, cold, painful, or loses feeling (numb).  See your doctor if the bandage seems to make your problems worse.  Elevation Elevation means keeping the injured area raised. Raise the injured area above your heart or the center of your chest if you can. When should I get help? You should get help if:  You keep having pain and swelling.  Your symptoms get worse.  Get help right away if: You should get help right away if:  You have sudden bad pain at or below the area of your injury.  You have redness or more swelling around your injury.  You have tingling or numbness at or below the injury that does not go away when you take off the bandage.  This information is not intended to replace advice given to you by your health care provider. Make sure you discuss any questions you have with your health care provider. Document Released: 04/18/2008 Document Revised: 09/27/2016 Document Reviewed: 10/08/2014 Elsevier Interactive Patient Education  2017  Elsevier Inc.  

## 2018-05-07 NOTE — Progress Notes (Signed)
   Subjective:    Patient ID: Matthew Morris, male    DOB: [birth year], 32 y.o.   MRN: [id number]  HPI The patient is a 32 YO man coming in for right ankle swelling. Recent trip to [place] and had right ankle swelling and plane on the flight home. He did more activity on the trip and did a significant amount of time walking. After getting home he did prop and rest ankle and it is subsided some. Still some swelling and pain. He had some good evening out with friends the night prior to onset of pain. He cannot recall all that happened but no one remembered anything happening to his foot. Originally some mild swelling in both feet with the plane/train ride but he elevated and the left foot went down. The right foot is improving but still swollen and mildly tender. He has taken ibuprofen which helped some.   Review of Systems  Constitutional: Positive for activity change. Negative for appetite change, fatigue, fever and unexpected weight change.  Respiratory: Negative.   Cardiovascular: Negative.   Musculoskeletal: Positive for arthralgias, joint swelling and myalgias. Negative for back pain, gait problem, neck pain and neck stiffness.  Skin: Negative.   Neurological: Negative for syncope, weakness and numbness.      Objective:   Physical Exam  Constitutional: He is oriented to person, place, and time. He appears well-developed and well-nourished.  HENT:  Head: Normocephalic and atraumatic.  Eyes: EOM are normal.  Neck: Normal range of motion.  Cardiovascular: Normal rate and regular rhythm.  Pulmonary/Chest: Effort normal and breath sounds normal. No respiratory distress. He has no wheezes. He has no rales.  Abdominal: Soft. Bowel sounds are normal. He exhibits no distension. There is no tenderness. There is no rebound.  Musculoskeletal: He exhibits edema and tenderness.  1+ edema right ankle with some tenderness to palpation over the medial ankle and lower leg. ROM intact and gait normal.     Neurological: He is alert and oriented to person, place, and time. Coordination normal.  Skin: Skin is warm and dry.   Vitals:   05/07/18 1516  BP: 108/80  Pulse: 81  Temp: 98.3 F (36.8 C)  TempSrc: Oral  SpO2: 97%  Weight: 161 lb (73 kg)  Height: 5' 8.5" (1.74 m)      Assessment & Plan:

## 2018-05-08 DIAGNOSIS — M25471 Effusion, right ankle: Secondary | ICD-10-CM | POA: Insufficient documentation

## 2018-05-08 NOTE — Assessment & Plan Note (Signed)
No fracture apparent but given swelling and unclear recollection of events will check right ankle x-ray. If no fracture will treat as sprain with RICE and ibuprofen for pain.

## 2018-05-09 ENCOUNTER — Telehealth: Payer: Self-pay

## 2018-05-09 NOTE — Telephone Encounter (Signed)
Patient informed that there were no fractures found in x-ray and to continue with plan from his visit. Patient states that he still has some swelling and some soreness but will continue elevating and icing ankle

## 2018-06-14 ENCOUNTER — Encounter: Payer: Self-pay | Admitting: Family

## 2018-06-14 ENCOUNTER — Ambulatory Visit: Payer: BLUE CROSS/BLUE SHIELD | Admitting: Family

## 2018-06-14 VITALS — BP 112/76 | HR 66 | Temp 98.1°F | Ht 68.5 in | Wt 164.0 lb

## 2018-06-14 DIAGNOSIS — B353 Tinea pedis: Secondary | ICD-10-CM

## 2018-06-14 DIAGNOSIS — L989 Disorder of the skin and subcutaneous tissue, unspecified: Secondary | ICD-10-CM | POA: Diagnosis not present

## 2018-06-14 MED ORDER — TERBINAFINE HCL 250 MG PO TABS: 250.0000 mg | ORAL_TABLET | Freq: Every day | ORAL | 0 refills | Status: AC

## 2018-06-14 NOTE — Patient Instructions (Signed)
Athlete's Foot Athlete's foot (tinea pedis) is a fungal infection of the skin on the feet. It often occurs on the skin that is between or underneath the toes. It can also occur on the soles of the feet. The infection can spread from person to person (is contagious). What are the causes? Athlete's foot is caused by a fungus. This fungus grows in warm, moist places. Most people get athlete's foot by sharing shower stalls, towels, and wet floors with someone who is infected. Not washing your feet or changing your socks often enough can contribute to athlete's foot. What increases the risk? This condition is more likely to develop in:  Men.  People who have a weak body defense system (immune system).  People who have diabetes.  People who use public showers, such as at a gym.  People who wear heavy-duty shoes, such as industrial or military shoes.  Seasons with warm, humid weather.  What are the signs or symptoms? Symptoms of this condition include:  Itchy areas between the toes or on the soles of the feet.  White, flaky, or scaly areas between the toes or on the soles of the feet.  Very itchy small blisters between the toes or on the soles of the feet.  Small cuts on the skin. These cuts can become infected.  Thick or discolored toenails.  How is this diagnosed? This condition is diagnosed with a medical history and physical exam. Your health care provider may also take a skin or toenail sample to be examined. How is this treated? Treatment for this condition includes antifungal medicines. These may be applied as powders, ointments, or creams. In severe cases, an oral antifungal medicine may be given. Follow these instructions at home:  Apply or take over-the-counter and prescription medicines only as told by your health care provider.  Keep all follow-up visits as told by your health care provider. This is important.  Do not scratch your feet.  Keep your feet dry: ? Wear  cotton or wool socks. Change your socks every day or if they become wet. ? Wear shoes that allow air to circulate, such as sandals or canvas tennis shoes.  Wash and dry your feet: ? Every day or as told by your health care provider. ? After exercising. ? Including the area between your toes.  Do not share towels, nail clippers, or other personal items that touch your feet with others.  If you have diabetes, keep your blood sugar under control. How is this prevented?  Do not share towels.  Wear sandals in wet areas, such as locker rooms and shared showers.  Keep your feet dry: ? Wear cotton or wool socks. Change your socks every day or if they become wet. ? Wear shoes that allow air to circulate, such as sandals or canvas tennis shoes.  Wash and dry your feet after exercising. Pay attention to the area between your toes. Contact a health care provider if:  You have a fever.  You have swelling, soreness, warmth, or redness in your foot.  You are not getting better with treatment.  Your symptoms get worse.  You have new symptoms. This information is not intended to replace advice given to you by your health care provider. Make sure you discuss any questions you have with your health care provider. Document Released: 10/28/2000 Document Revised: 04/07/2016 Document Reviewed: 05/04/2015 Elsevier Interactive Patient Education  2018 [employer]vier Inc[employer]  

## 2018-06-14 NOTE — Progress Notes (Signed)
Matthew Morris is a 32 y.o. male with the following history as recorded in [facility]:  Patient Active Problem List   Diagnosis Date Noted  . Right ankle swelling 05/08/2018  . Travel advice encounter 04/03/2018  . Olecranon bursitis of right elbow 12/15/2016  . TOBACCO ABUSE 01/18/2008  . Attention deficit hyperactivity disorder (ADHD) 08/13/2007    Current Outpatient Medications  Medication Sig Dispense Refill  . betamethasone dipropionate (DIPROLENE) 0.05 % cream Apply topically 2 (two) times daily. (Patient not taking: Reported on 06/14/2018) 30 g 0  . terbinafine (LAMISIL) 250 MG tablet Take 1 tablet (250 mg total) by mouth daily. 10 tablet 0   No current facility-administered medications for this visit.     Allergies: Chantix [varenicline] and Ritalin [methylphenidate hcl]  Past Medical History:  Diagnosis Date  . ADD (attention deficit disorder)   . Sebaceous cyst   . TOBACCO ABUSE     Past Surgical History:  Procedure Laterality Date  . oral surgeries    . TONSILLECTOMY      Family History  Problem Relation Age of Onset  . Hypotension Father   . Depression Mother   . Alcohol abuse Mother   . Diabetes Other   . Drug abuse Brother   . Alcohol abuse Brother     Social History   Tobacco Use  . Smoking status: Former Smoker    Types: Cigarettes    Last attempt to quit: 11/14/2015    Years since quitting: 2.5  . Smokeless tobacco: Never Used  . Tobacco comment: see 03/26/14  Substance Use Topics  . Alcohol use: Yes    Comment:  socially    Subjective:  Patient is concerned about athlete's foot on bottom of both of his feet; has been using OTC medication with mild improvement; symptoms present x 5 weeks; does play soccer- tries to keep his feet as dry as possible but does sweat; Also requesting referral to dermatology for "head to toe" skin check;   Objective:  Vitals:   06/14/18 1536  BP: 112/76  Pulse: 66  Temp: 98.1 F (36.7 C)  TempSrc: Oral  SpO2: 98%   Weight: 164 lb (74.4 kg)  Height: 5' 8.5" (1.74 m)    General: Well developed, well nourished, in no acute distress  Skin : Warm and dry. Dry, scaly noted on bottoms of feet Head: Normocephalic and atraumatic  Lungs: Respirations unlabored; clear to auscultation bilaterally without wheeze, rales, rhonchi  Neurologic: Alert and oriented; speech intact; face symmetrical; moves all extremities well; CNII-XII intact without focal deficit   Assessment:  1. Tinea pedis of both feet   2. Skin lesion     Plan:  1. Rx for Lamisil 250 mg qd x 10 days; keep feet dry as much as possible; follow-up if symptoms persist- may need to see foot specialist. 2. Refer to dermatology per patient request.   No follow-ups on file.  Orders Placed This Encounter  Procedures  . Ambulatory referral to Dermatology    Referral Priority:   Routine    Referral Type:   Consultation    Referral Reason:   Specialty Services Required    Referred to Provider:   Allyn Kenner, MD    Requested Specialty:   Dermatology    Number of Visits Requested:   1    Requested Prescriptions   Signed Prescriptions Disp Refills  . terbinafine (LAMISIL) 250 MG tablet 10 tablet 0    Sig: Take 1 tablet (250 mg total) by  mouth daily.

## 2018-06-29 DIAGNOSIS — D225 Melanocytic nevi of trunk: Secondary | ICD-10-CM | POA: Diagnosis not present

## 2018-06-29 DIAGNOSIS — B070 Plantar wart: Secondary | ICD-10-CM | POA: Diagnosis not present

## 2018-06-29 DIAGNOSIS — Z1283 Encounter for screening for malignant neoplasm of skin: Secondary | ICD-10-CM | POA: Diagnosis not present

## 2018-06-29 DIAGNOSIS — L858 Other specified epidermal thickening: Secondary | ICD-10-CM | POA: Diagnosis not present

## 2018-10-01 IMAGING — DX DG FINGER RING 2+V*R*
3 series · 3 of 3 positions shown · non-contrast
Comparison: None.

CLINICAL DATA: Pain of the right finger post injury.

EXAM:
RIGHT RING FINGER 2+V

[finger ap]
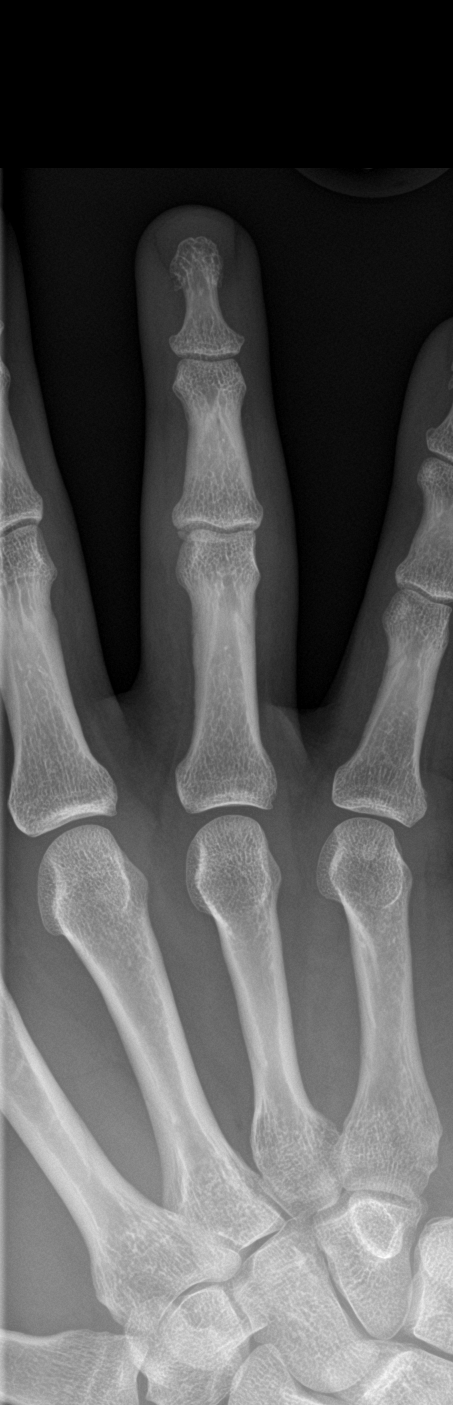

[finger obl]
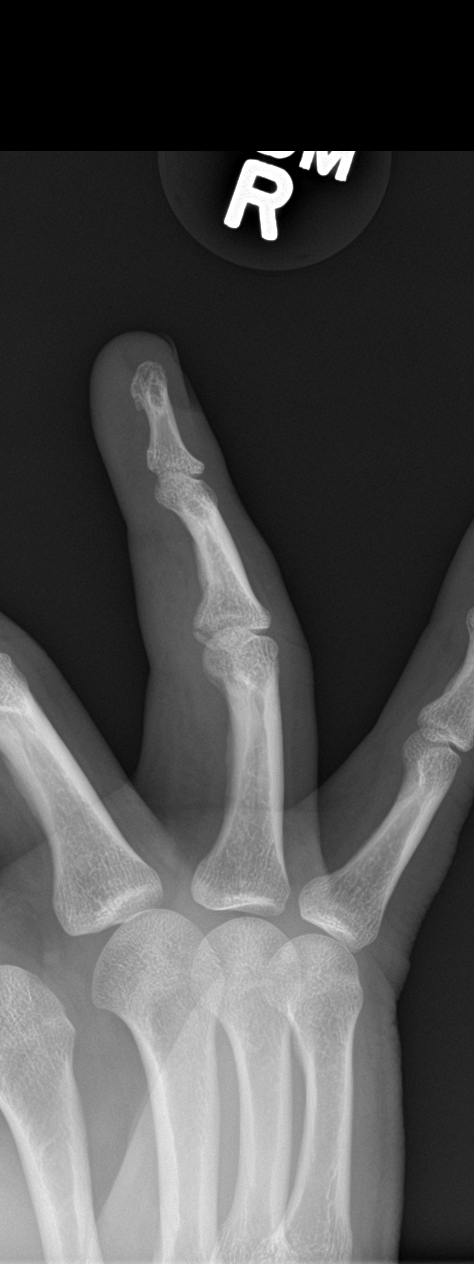

[finger lat]
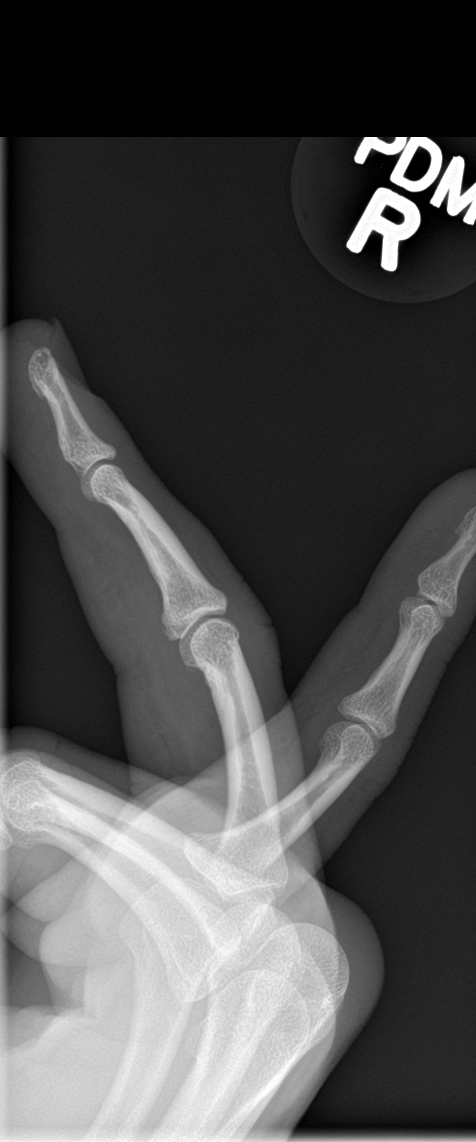

[3 of 3 positions shown; findings below may reference images not displayed]

FINDINGS: There is an avulsion fracture of the base of the middle right fourth
phalanx with extension to the proximal interphalangeal joint. There
is an associated soft tissue swelling. No other fractures are
visualized. Osseous mineralization is normal.
IMPRESSION: Avulsion fracture of the base of the middle right fourth phalanx.

## 2018-12-04 ENCOUNTER — Encounter: Payer: Self-pay | Admitting: Internal Medicine

## 2018-12-04 ENCOUNTER — Ambulatory Visit: Payer: BLUE CROSS/BLUE SHIELD | Admitting: Internal Medicine

## 2018-12-04 DIAGNOSIS — R059 Cough, unspecified: Secondary | ICD-10-CM

## 2018-12-04 DIAGNOSIS — R05 Cough: Secondary | ICD-10-CM

## 2018-12-04 MED ORDER — AZITHROMYCIN 250 MG PO TABS: ORAL_TABLET | ORAL | 0 refills | Status: AC

## 2018-12-04 NOTE — Assessment & Plan Note (Signed)
Rx for azithromycin and encouraged to start taking zyrtec daily.

## 2018-12-04 NOTE — Patient Instructions (Addendum)
We have sent in the azithromycin to take 2 pills today. Then starting tomorrow take 1 pill daily until gone.   Start taking zyrtec (cetirizine) 1 pill daily for next 1-2 weeks.

## 2018-12-04 NOTE — Progress Notes (Signed)
   Subjective:   Patient ID: Matthew Morris, male    DOB: 11/17/1985, 33 y.o.   MRN: [id number]  HPI The patient is a 33 y.o. man coming in for cold symptoms. Started about 12 days ago. Main symptoms are: cough, nose drainage, sinus pressure. Denies fevers or chills or body aches. Overall it is worsening. Has tried nyquil. Has sick contact of brother who was sick with similar.   Review of Systems  Constitutional: Positive for activity change and appetite change. Negative for chills, fatigue, fever and unexpected weight change.  HENT: Positive for congestion, postnasal drip, rhinorrhea and sinus pressure. Negative for ear discharge, ear pain, sinus pain, sneezing, sore throat, tinnitus, trouble swallowing and voice change.   Eyes: Negative.   Respiratory: Positive for cough. Negative for chest tightness, shortness of breath and wheezing.   Cardiovascular: Negative.   Gastrointestinal: Negative.   Musculoskeletal: Positive for myalgias.  Neurological: Negative.     Objective:  Physical Exam Constitutional:      Appearance: He is well-developed.  HENT:     Head: Normocephalic and atraumatic.     Comments: Oropharynx with redness and clear drainage, nose with swollen turbinates, TMs normal bilaterally.  Neck:     Musculoskeletal: Normal range of motion.     Thyroid: No thyromegaly.  Cardiovascular:     Rate and Rhythm: Normal rate and regular rhythm.  Pulmonary:     Effort: Pulmonary effort is normal. No respiratory distress.     Breath sounds: Normal breath sounds. No wheezing or rales.  Abdominal:     Palpations: Abdomen is soft.  Musculoskeletal:        General: Tenderness present.  Lymphadenopathy:     Cervical: No cervical adenopathy.  Skin:    General: Skin is warm and dry.  Neurological:     Mental Status: He is alert and oriented to person, place, and time.     Vitals:   12/04/18 1456  BP: 110/70  Pulse: 69  Temp: 98.6 F (37 C)  TempSrc: Oral  SpO2: 99%    Weight: 162 lb (73.5 kg)  Height: 5' 8.5" (1.74 m)    Assessment & Plan:

## 2019-09-16 DIAGNOSIS — Z20828 Contact with and (suspected) exposure to other viral communicable diseases: Secondary | ICD-10-CM | POA: Diagnosis not present

## 2019-11-05 DIAGNOSIS — Z20828 Contact with and (suspected) exposure to other viral communicable diseases: Secondary | ICD-10-CM | POA: Diagnosis not present

## 2020-02-24 ENCOUNTER — Ambulatory Visit: Payer: Self-pay | Admitting: Internal Medicine

## 2020-02-24 ENCOUNTER — Other Ambulatory Visit: Payer: Self-pay

## 2020-02-27 ENCOUNTER — Ambulatory Visit: Payer: Self-pay | Admitting: Internal Medicine
